# Patient Record
Sex: Female | Born: 1995
Health system: Southern US, Community
[De-identification: ages and names within clinical notes are randomized; demographics above are authoritative.]

## PROBLEM LIST (undated history)

## (undated) DIAGNOSIS — F419 Anxiety disorder, unspecified: Secondary | ICD-10-CM

## (undated) DIAGNOSIS — Z0000 Encounter for general adult medical examination without abnormal findings: Secondary | ICD-10-CM

## (undated) DIAGNOSIS — Z309 Encounter for contraceptive management, unspecified: Secondary | ICD-10-CM

## (undated) DIAGNOSIS — T7840XA Allergy, unspecified, initial encounter: Secondary | ICD-10-CM

## (undated) HISTORY — DX: Allergy, unspecified, initial encounter: T78.40XA

## (undated) HISTORY — DX: Encounter for contraceptive management, unspecified: Z30.9

---
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
## 2018-12-01 ENCOUNTER — Ambulatory Visit
Admission: RE | Admit: 2018-12-01 | Discharge: 2018-12-01 | Disposition: A | Discharge status: Home / Self Care | Payer: No Typology Code available for payment source | Source: Ambulatory Visit | Attending: Nurse Practitioner | Admitting: Nurse Practitioner

## 2018-12-01 ENCOUNTER — Other Ambulatory Visit: Payer: Self-pay | Admitting: Nurse Practitioner

## 2018-12-01 DIAGNOSIS — Z021 Encounter for pre-employment examination: Secondary | ICD-10-CM

## 2019-03-07 ENCOUNTER — Other Ambulatory Visit: Payer: Self-pay

## 2019-03-07 ENCOUNTER — Encounter: Payer: Self-pay | Admitting: Adult Health Nurse Practitioner

## 2019-03-07 ENCOUNTER — Ambulatory Visit (INDEPENDENT_AMBULATORY_CARE_PROVIDER_SITE_OTHER): Payer: No Typology Code available for payment source | Admitting: Adult Health Nurse Practitioner

## 2019-03-07 DIAGNOSIS — Z0000 Encounter for general adult medical examination without abnormal findings: Secondary | ICD-10-CM | POA: Diagnosis not present

## 2019-03-07 HISTORY — DX: Encounter for general adult medical examination without abnormal findings: Z00.00

## 2019-03-07 NOTE — Progress Notes (Signed)
Chief Complaint  Patient presents with  . Establish Care    HPI  Patient is a pleasant 23 year old female who presents to establish care and for a physical exam.  Pronouns are she, hers.  She is married x 1 year.  Trying to conceive.  Was working with her previous PCP, Dr. Blase Mess in [place] and currently on Letrozole.  She has completed 4 cycles without a pregnancy.  Currently has an appointment in October for OB here in [place], [place].  She is not necessarily in a rush to have a child but would like to understand why she hasn't been able to conceive yet and wishes to have children in her 5s.   Last PAP smear 1 year ago with PCP.  Will work on obtaining records.   Margaruite has no complaints today.   Past Medical History:  Diagnosis Date  . Allergy   . Physical exam, annual 03/07/2019    No current outpatient medications on file.   No current facility-administered medications for this visit.     Allergies: Not on File  History reviewed. No pertinent surgical history.  Social History   Socioeconomic History  . Marital status: Married    Spouse name: Not on file  . Number of children: 0  . Years of education: Not on file  . Highest education level: Not on file  Occupational History  . Occupation: patient access specialist    Employer: [employer]  Social Needs  . Financial resource strain: Not on file  . Food insecurity    Worry: Not on file    Inability: Not on file  . Transportation needs    Medical: Not on file    Non-medical: Not on file  Tobacco Use  . Smoking status: Never Smoker  . Smokeless tobacco: Never Used  Substance and Sexual Activity  . Alcohol use: Never    Frequency: Never  . Drug use: Never  . Sexual activity: Yes  Lifestyle  . Physical activity    Days per week: Not on file    Minutes per session: Not on file  . Stress: Not on file  Relationships  . Social Musician on phone: Not on file    Gets together: Not on file     Attends religious service: Not on file    Active member of club or organization: Not on file    Attends meetings of clubs or organizations: Not on file    Relationship status: Not on file  Other Topics Concern  . Not on file  Social History Narrative  . Not on file    History reviewed. Mother, 62, with sarcoid. Father, HTN MGM:  Breast Cancer, alive Maternal GGM:  Breast Cancer    ROS Review of Systems See HPI Constitution: No fevers or chills No malaise No diaphoresis Skin: No rash or itching Eyes: no blurry vision, no double vision GU: no dysuria or hematuria Neuro: no dizziness or headaches all others reviewed and negative   Objective: Vitals:   03/07/19 0804  BP: 118/71  Pulse: 85  Resp: 18  Temp: 98.1 F (36.7 C)  TempSrc: Oral  SpO2: 100%  Weight: 117 lb 6.4 oz (53.3 kg)  Height: 5\' 1"  (1.549 m)    Physical Exam Constitutional:      Appearance: Normal appearance. She is normal weight.  HENT:     Head: Normocephalic and atraumatic.     Right Ear: Tympanic membrane, ear canal and external  ear normal.     Left Ear: Tympanic membrane, ear canal and external ear normal.     Nose: Nose normal.  Eyes:     Extraocular Movements: Extraocular movements intact.     Conjunctiva/sclera: Conjunctivae normal.     Pupils: Pupils are equal, round, and reactive to light.  Neck:     Musculoskeletal: Normal range of motion and neck supple.  Cardiovascular:     Rate and Rhythm: Normal rate and regular rhythm.     Pulses: Normal pulses.     Heart sounds: Normal heart sounds. No murmur.  Pulmonary:     Effort: Pulmonary effort is normal.     Breath sounds: Normal breath sounds.  Abdominal:     Palpations: Abdomen is soft.  Musculoskeletal: Normal range of motion.  Skin:    General: Skin is warm and dry.     Capillary Refill: Capillary refill takes less than 2 seconds.  Neurological:     General: No focal deficit present.     Mental Status: She is alert and  oriented to person, place, and time.  Psychiatric:        Mood and Affect: Mood normal.        Behavior: Behavior normal.        Thought Content: Thought content normal.        Judgment: Judgment normal.       Assessment and Plan Amoree was seen today for establish care.  Diagnoses and all orders for this visit:  Physical exam, annual   1.  All questions answered.  Will obtain PAP record from previous PCP.  Patient is UTD on vaccines.  Had flu shot for work. Will f/u as needed or within 1 year.  She is inline with this plan.   Glyn Ade

## 2019-03-07 NOTE — Patient Instructions (Signed)
Health Maintenance, Female Adopting a healthy lifestyle and getting preventive care are important in promoting health and wellness. Ask your health care provider about:  The right schedule for you to have regular tests and exams.  Things you can do on your own to prevent diseases and keep yourself healthy. What should I know about diet, weight, and exercise? Eat a healthy diet   Eat a diet that includes plenty of vegetables, fruits, low-fat dairy products, and lean protein.  Do not eat a lot of foods that are high in solid fats, added sugars, or sodium. Maintain a healthy weight Body mass index (BMI) is used to identify weight problems. It estimates body fat based on height and weight. Your health care provider can help determine your BMI and help you achieve or maintain a healthy weight. Get regular exercise Get regular exercise. This is one of the most important things you can do for your health. Most adults should:  Exercise for at least 150 minutes each week. The exercise should increase your heart rate and make you sweat (moderate-intensity exercise).  Do strengthening exercises at least twice a week. This is in addition to the moderate-intensity exercise.  Spend less time sitting. Even light physical activity can be beneficial. Watch cholesterol and blood lipids Have your blood tested for lipids and cholesterol at 23 years of age, then have this test every 5 years. Have your cholesterol levels checked more often if:  Your lipid or cholesterol levels are high.  You are older than 23 years of age.  You are at high risk for heart disease. What should I know about cancer screening? Depending on your health history and family history, you may need to have cancer screening at various ages. This may include screening for:  Breast cancer.  Cervical cancer.  Colorectal cancer.  Skin cancer.  Lung cancer. What should I know about heart disease, diabetes, and high blood  pressure? Blood pressure and heart disease  High blood pressure causes heart disease and increases the risk of stroke. This is more likely to develop in people who have high blood pressure readings, are of African descent, or are overweight.  Have your blood pressure checked: ? Every 3-5 years if you are 18-39 years of age. ? Every year if you are 40 years old or older. Diabetes Have regular diabetes screenings. This checks your fasting blood sugar level. Have the screening done:  Once every three years after age 40 if you are at a normal weight and have a low risk for diabetes.  More often and at a younger age if you are overweight or have a high risk for diabetes. What should I know about preventing infection? Hepatitis B If you have a higher risk for hepatitis B, you should be screened for this virus. Talk with your health care provider to find out if you are at risk for hepatitis B infection. Hepatitis C Testing is recommended for:  Everyone born from 1945 through 1965.  Anyone with known risk factors for hepatitis C. Sexually transmitted infections (STIs)  Get screened for STIs, including gonorrhea and chlamydia, if: ? You are sexually active and are younger than 24 years of age. ? You are older than 24 years of age and your health care provider tells you that you are at risk for this type of infection. ? Your sexual activity has changed since you were last screened, and you are at increased risk for chlamydia or gonorrhea. Ask your health care provider if   you are at risk.  Ask your health care provider about whether you are at high risk for HIV. Your health care provider may recommend a prescription medicine to help prevent HIV infection. If you choose to take medicine to prevent HIV, you should first get tested for HIV. You should then be tested every 3 months for as long as you are taking the medicine. Pregnancy  If you are about to stop having your period (premenopausal) and  you may become pregnant, seek counseling before you get pregnant.  Take 400 to 800 micrograms (mcg) of folic acid every day if you become pregnant.  Ask for birth control (contraception) if you want to prevent pregnancy. Osteoporosis and menopause Osteoporosis is a disease in which the bones lose minerals and strength with aging. This can result in bone fractures. If you are 65 years old or older, or if you are at risk for osteoporosis and fractures, ask your health care provider if you should:  Be screened for bone loss.  Take a calcium or vitamin D supplement to lower your risk of fractures.  Be given hormone replacement therapy (HRT) to treat symptoms of menopause. Follow these instructions at home: Lifestyle  Do not use any products that contain nicotine or tobacco, such as cigarettes, e-cigarettes, and chewing tobacco. If you need help quitting, ask your health care provider.  Do not use street drugs.  Do not share needles.  Ask your health care provider for help if you need support or information about quitting drugs. Alcohol use  Do not drink alcohol if: ? Your health care provider tells you not to drink. ? You are pregnant, may be pregnant, or are planning to become pregnant.  If you drink alcohol: ? Limit how much you use to 0-1 drink a day. ? Limit intake if you are breastfeeding.  Be aware of how much alcohol is in your drink. In the [place] one drink equals one 12 oz bottle of beer (355 mL), one 5 oz glass of wine (148 mL), or one 1 oz glass of hard liquor (44 mL). General instructions  Schedule regular health, dental, and eye exams.  Stay current with your vaccines.  Tell your health care provider if: ? You often feel depressed. ? You have ever been abused or do not feel safe at home. Summary  Adopting a healthy lifestyle and getting preventive care are important in promoting health and wellness.  Follow your health care provider's instructions about healthy  diet, exercising, and getting tested or screened for diseases.  Follow your health care provider's instructions on monitoring your cholesterol and blood pressure. This information is not intended to replace advice given to you by your health care provider. Make sure you discuss any questions you have with your health care provider. Document Released: 12/07/2010 Document Revised: 05/17/2018 Document Reviewed: 05/17/2018 Elsevier Patient Education  2020 Elsevier Inc.  

## 2019-04-06 ENCOUNTER — Ambulatory Visit (INDEPENDENT_AMBULATORY_CARE_PROVIDER_SITE_OTHER): Payer: No Typology Code available for payment source | Admitting: Adult Health Nurse Practitioner

## 2019-04-06 ENCOUNTER — Other Ambulatory Visit: Payer: Self-pay

## 2019-04-06 ENCOUNTER — Encounter: Payer: Self-pay | Admitting: Adult Health Nurse Practitioner

## 2019-04-06 VITALS — BP 112/71 | HR 82 | Temp 98.5°F | Resp 18 | Ht 61.42 in | Wt 119.0 lb

## 2019-04-06 DIAGNOSIS — F419 Anxiety disorder, unspecified: Secondary | ICD-10-CM

## 2019-04-06 HISTORY — DX: Anxiety disorder, unspecified: F41.9

## 2019-04-06 MED ORDER — CLONAZEPAM 0.5 MG PO TABS: ORAL_TABLET | ORAL | 0 refills | Status: DC

## 2019-04-06 MED ORDER — SERTRALINE HCL 50 MG PO TABS: ORAL_TABLET | ORAL | 3 refills | Status: DC

## 2019-04-06 NOTE — Progress Notes (Signed)
Established Patient Office Visit  Subjective:  Patient ID: Cheryl Perkins, female    DOB: 1995/09/04  Age: 23 y.o. MRN: [id number]  CC:  Chief Complaint  Patient presents with  . Anxiety    worsening tension, not able to talk herself down from it any more.  some panic attacks. feels like all of this has been worsening throughout the year.  has new job since august.     HPI [employer] presents for worsening anxiety.  She acknowledges that she has had anxiety throughout the year and when seen before, it did not seem like it was at the point of needing intervention.  However, over the past 1-2 months, anxiety has increased often manifested by anger, discomfort. Life is stressful and other events such as her dad who has been absent in her life texting her that he missed her. This has caused an uptake in her stress and anxiety.  Previously, she felt that her anxiety was something she could talk through.  Right now, though; it is affecting her day to day life and is ready to discuss medication for anxiety.  She denies any feelings of hurting self or others.  She does have a very supportive husband who has been there to bring her here today and to listen.  She is open to talking to a therapist.  Her husband had encouraged that.   Past Medical History:  Diagnosis Date  . Allergy   . Physical exam, annual 03/07/2019    Past Surgical History:  Procedure Laterality Date  . KNEE ARTHROSCOPY WITH ANTERIOR CRUCIATE LIGAMENT (ACL) REPAIR  2013   had first 2010    Family History  Problem Relation Age of Onset  . Sarcoidosis Mother   . Hypertension Father   . Healthy Brother   . Breast cancer Maternal Grandfather   . Breast cancer Maternal Great-grandmother       Outpatient Medications Prior to Visit  Medication Sig Dispense Refill  . letrozole (FEMARA) 2.5 MG tablet Take by mouth.     No facility-administered medications prior to visit.     Allergies  Allergen Reactions  .  Dicyclomine Rash    ROS Review of Systems   Psychological ROS: positive for - anxiety and irritability negative for - behavioral disorder    Objective:    Physical Exam  Constitutional: She is oriented to person, place, and time. She appears well-developed and well-nourished.  Neurological: She is alert and oriented to person, place, and time.  Psychiatric: Her speech is normal and behavior is normal. Judgment normal. Her mood appears anxious. Her affect is not angry and not labile. Cognition and memory are normal. She expresses no suicidal ideation. She expresses no suicidal plans.    BP 112/71 (BP Location: Right Arm, Patient Position: Sitting, Cuff Size: Normal)   Pulse 82   Temp 98.5 F (36.9 C) (Oral)   Resp 18   Ht 5' 1.42" (1.56 m)   Wt 119 lb (54 kg)   LMP 03/21/2019   SpO2 97%   BMI 22.18 kg/m  Wt Readings from Last 3 Encounters:  04/06/19 119 lb (54 kg)  03/07/19 117 lb 6.4 oz (53.3 kg)    . Health Maintenance Due  Topic Date Due  . HIV Screening  05/17/2011  . TETANUS/TDAP  05/17/2015     Assessment & Plan:    1. Moderate anxiety    Meds ordered this encounter  Medications  . sertraline (ZOLOFT) 50 MG tablet  Sig: 1/2 tab for 1 week, then 1 tab daily    Dispense:  30 tablet    Refill:  3  . clonazePAM (KLONOPIN) 0.5 MG tablet    Sig: 1/2 to 1 tab up to two times daily    Dispense:  30 tablet    Refill:  0     Follow-up: Return in about 1 month (around 05/07/2019).    Elyse Jarvis, NP   A total of 25 minutes were spent face-to-face with the patient during this encounter and over half of that time was spent on counseling and coordination of care.

## 2019-04-27 ENCOUNTER — Ambulatory Visit (INDEPENDENT_AMBULATORY_CARE_PROVIDER_SITE_OTHER): Payer: No Typology Code available for payment source | Admitting: Psychology

## 2019-04-27 DIAGNOSIS — F4321 Adjustment disorder with depressed mood: Secondary | ICD-10-CM

## 2019-04-30 ENCOUNTER — Telehealth: Payer: Self-pay | Admitting: Adult Health Nurse Practitioner

## 2019-04-30 NOTE — Telephone Encounter (Signed)
Please advise if pt needs appt or when referral is ordered  Copied from [place] [phone]. Topic: Referral - Request for Referral >> Apr 30, 2019  2:56 PM Scherrie Gerlach wrote: Pt has [employer] and needs referral to [facility].  Fax: [phone]

## 2019-05-01 ENCOUNTER — Other Ambulatory Visit: Payer: Self-pay

## 2019-05-01 DIAGNOSIS — N979 Female infertility, unspecified: Secondary | ICD-10-CM

## 2019-05-01 NOTE — Telephone Encounter (Signed)
Referral has been placed and pt informed

## 2019-05-08 ENCOUNTER — Encounter: Payer: Self-pay | Admitting: Adult Health Nurse Practitioner

## 2019-05-08 ENCOUNTER — Other Ambulatory Visit: Payer: Self-pay

## 2019-05-08 ENCOUNTER — Ambulatory Visit (INDEPENDENT_AMBULATORY_CARE_PROVIDER_SITE_OTHER): Payer: No Typology Code available for payment source | Admitting: Adult Health Nurse Practitioner

## 2019-05-08 VITALS — BP 120/77 | HR 90 | Temp 98.3°F | Resp 17 | Ht 61.42 in | Wt 121.4 lb

## 2019-05-08 DIAGNOSIS — F419 Anxiety disorder, unspecified: Secondary | ICD-10-CM | POA: Diagnosis not present

## 2019-05-08 MED ORDER — SERTRALINE HCL 50 MG PO TABS: 50.0000 mg | ORAL_TABLET | Freq: Every day | ORAL | 4 refills | Status: DC

## 2019-05-08 NOTE — Patient Instructions (Signed)
° ° ° °  If you have lab work done today you will be contacted with your lab results within the next 2 weeks.  If you have not heard from us then please contact us. The fastest way to get your results is to register for My Chart. ° ° °IF you received an x-ray today, you will receive an invoice from [facility]. Please contact [facility] at [phone] with questions or concerns regarding your invoice.  ° °IF you received labwork today, you will receive an invoice from [facility]. Please contact [facility] at [phone] with questions or concerns regarding your invoice.  ° °Our billing staff will not be able to assist you with questions regarding bills from these companies. ° °You will be contacted with the lab results as soon as they are available. The fastest way to get your results is to activate your My Chart account. Instructions are located on the last page of this paperwork. If you have not heard from us regarding the results in 2 weeks, please contact this office. °  ° ° ° °

## 2019-05-18 ENCOUNTER — Ambulatory Visit (INDEPENDENT_AMBULATORY_CARE_PROVIDER_SITE_OTHER): Payer: No Typology Code available for payment source | Admitting: Psychology

## 2019-05-18 DIAGNOSIS — F4321 Adjustment disorder with depressed mood: Secondary | ICD-10-CM

## 2019-06-15 ENCOUNTER — Ambulatory Visit (INDEPENDENT_AMBULATORY_CARE_PROVIDER_SITE_OTHER): Payer: No Typology Code available for payment source | Admitting: Psychology

## 2019-06-15 DIAGNOSIS — F4321 Adjustment disorder with depressed mood: Secondary | ICD-10-CM

## 2019-07-06 ENCOUNTER — Ambulatory Visit (INDEPENDENT_AMBULATORY_CARE_PROVIDER_SITE_OTHER): Payer: No Typology Code available for payment source | Admitting: Psychology

## 2019-07-06 DIAGNOSIS — F4321 Adjustment disorder with depressed mood: Secondary | ICD-10-CM | POA: Diagnosis not present

## 2019-07-27 ENCOUNTER — Ambulatory Visit: Payer: No Typology Code available for payment source | Admitting: Psychology

## 2019-08-07 ENCOUNTER — Ambulatory Visit: Payer: No Typology Code available for payment source | Admitting: Adult Health Nurse Practitioner

## 2019-08-16 ENCOUNTER — Other Ambulatory Visit: Payer: Self-pay

## 2019-08-16 ENCOUNTER — Ambulatory Visit (INDEPENDENT_AMBULATORY_CARE_PROVIDER_SITE_OTHER): Payer: No Typology Code available for payment source | Admitting: Adult Health Nurse Practitioner

## 2019-08-16 ENCOUNTER — Encounter: Payer: Self-pay | Admitting: Adult Health Nurse Practitioner

## 2019-08-16 VITALS — BP 115/74 | HR 76 | Temp 98.7°F | Ht 60.0 in | Wt 120.0 lb

## 2019-08-16 DIAGNOSIS — R6882 Decreased libido: Secondary | ICD-10-CM

## 2019-08-16 DIAGNOSIS — J301 Allergic rhinitis due to pollen: Secondary | ICD-10-CM | POA: Diagnosis not present

## 2019-08-16 MED ORDER — BUPROPION HCL ER (XL) 150 MG PO TB24: 150.0000 mg | ORAL_TABLET | Freq: Every day | ORAL | 3 refills | Status: DC

## 2019-08-16 MED ORDER — MOMETASONE FUROATE 50 MCG/ACT NA SUSP: 2.0000 | Freq: Every day | NASAL | 3 refills | Status: DC

## 2019-08-16 NOTE — Progress Notes (Signed)
History   Chief Complaint  Patient presents with  . Epistaxis    Pt stated that she went to sneeze this moring a a large amount of blood came up. She stated that this is the first time that this has happened.    HPI   Cheryl Perkins is having increased allergic rhinitis symptoms as the pollen has started.  Mostly stuffy nose but this morning, she sneezed and had a bloody nose which was significant for her.  Her husband helped stop the bloody nose.  No symptoms currently except for the allergies.  We discussed using a nasal steroid to help symptoms.    Anxiety/Depression: Stable currently.  Cheryl Perkins  Lost her grandmother in 1/21.  She is still grieving but is able to be able to strong for her family.  She is questioning whether Zoloft can affect sexual drive.  We discussed side effects of SSRIs.  She does not want to decrease dose of Zoloft which is effective for her right now with her anxiety.  We discussed starting Wellbutrin 150mg  to help with sexual side effects.  Will start at 150mg  but can increase depending on how patient responds.  She is amenable to that.    Past Medical History:  Diagnosis Date  . Allergy   . Moderate anxiety 04/06/2019  . Physical exam, annual 03/07/2019   Past Surgical History:  Procedure Laterality Date  . KNEE ARTHROSCOPY WITH ANTERIOR CRUCIATE LIGAMENT (ACL) REPAIR  2013   had first 2010   Family History  Problem Relation Age of Onset  . Sarcoidosis Mother   . Hypertension Father   . Healthy Brother   . Breast cancer Maternal Grandfather   . Breast cancer Maternal Great-grandmother    Social History   Tobacco Use  . Smoking status: Never Smoker  . Smokeless tobacco: Never Used  Substance Use Topics  . Alcohol use: Never  . Drug use: Never   OB History   No obstetric history on file.    Review of Systems   Review of Systems  Constitutional: Negative for activity change, appetite change, chills and fever.  HENT: Negative for  congestion, nosebleeds, trouble swallowing and voice change.   Respiratory: Negative for cough, shortness of breath and wheezing.   Gastrointestinal: Negative for diarrhea, nausea and vomiting.  Genitourinary: Negative for difficulty urinating, dysuria, flank pain and hematuria.  Musculoskeletal: Negative for back pain, joint swelling and neck pain.  Neurological: Negative for dizziness, speech difficulty, light-headedness and numbness. Psychological ROS: positive for - sexual side effects:  decrease libido negative for - anxiety, depression or mood swings  See HPI. All other review of systems negative.    Allergies   Dicyclomine  Home Medications    Current Outpatient Medications:  .  clonazePAM (KLONOPIN) 0.5 MG tablet, 1/2 to 1 tab up to two times daily, Disp: 30 tablet, Rfl: 0 .  buPROPion (WELLBUTRIN XL) 150 MG 24 hr tablet, Take 1 tablet (150 mg total) by mouth daily., Disp: 30 tablet, Rfl: 3 .  letrozole (FEMARA) 2.5 MG tablet, Take by mouth., Disp: , Rfl:  .  mometasone (NASONEX) 50 MCG/ACT nasal spray, Place 2 sprays into the nose daily., Disp: 17 g, Rfl: 3 .  sertraline (ZOLOFT) 50 MG tablet, Take 1 tablet (50 mg total) by mouth daily., Disp: 30 tablet, Rfl: 4  Meds Ordered and Administered this Visit   Meds ordered this encounter  Medications  . mometasone (NASONEX) 50 MCG/ACT nasal spray    Sig: Place  2 sprays into the nose daily.    Dispense:  17 g    Refill:  3  . buPROPion (WELLBUTRIN XL) 150 MG 24 hr tablet    Sig: Take 1 tablet (150 mg total) by mouth daily.    Dispense:  30 tablet    Refill:  3    BP 115/74 (BP Location: Right Arm, Patient Position: Sitting, Cuff Size: Normal)   Pulse 76   Temp 98.7 F (37.1 C) (Temporal)   Ht 5' (1.524 m)   Wt 120 lb (54.4 kg)   LMP 08/13/2019   SpO2 100%   BMI 23.44 kg/m   Physical Exam   General appearance: alert, well appearing, and in no distress, oriented to person, place, and time and anxious. Chest:  clear to auscultation, no wheezes, rales or rhonchi, symmetric air entry.  CVS exam: normal rate, regular rhythm, normal S1, S2, no murmurs, rubs, clicks or gallops. Skin exam - normal coloration and turgor, no rashes, no suspicious skin lesions noted. Mental Status: normal mood, behavior, speech, dress, motor activity, and thought processes, anxious.    MDM   1. Decreased libido without sexual dysfunction   2. Seasonal allergic rhinitis due to pollen    Meds ordered this encounter  Medications  . mometasone (NASONEX) 50 MCG/ACT nasal spray    Sig: Place 2 sprays into the nose daily.    Dispense:  17 g    Refill:  3  . buPROPion (WELLBUTRIN XL) 150 MG 24 hr tablet    Sig: Take 1 tablet (150 mg total) by mouth daily.    Dispense:  30 tablet    Refill:  3   Will f/u at next scheduled appointment.  Patient is inline with this plan.

## 2019-08-16 NOTE — Patient Instructions (Signed)
° ° ° °  If you have lab work done today you will be contacted with your lab results within the next 2 weeks.  If you have not heard from us then please contact us. The fastest way to get your results is to register for My Chart. ° ° °IF you received an x-ray today, you will receive an invoice from [facility]. Please contact [facility] at [phone] with questions or concerns regarding your invoice.  ° °IF you received labwork today, you will receive an invoice from [facility]. Please contact [facility] at [phone] with questions or concerns regarding your invoice.  ° °Our billing staff will not be able to assist you with questions regarding bills from these companies. ° °You will be contacted with the lab results as soon as they are available. The fastest way to get your results is to activate your My Chart account. Instructions are located on the last page of this paperwork. If you have not heard from us regarding the results in 2 weeks, please contact this office. °  ° ° ° °

## 2019-09-14 ENCOUNTER — Ambulatory Visit (INDEPENDENT_AMBULATORY_CARE_PROVIDER_SITE_OTHER): Payer: No Typology Code available for payment source | Admitting: Adult Health Nurse Practitioner

## 2019-09-14 ENCOUNTER — Encounter: Payer: Self-pay | Admitting: Adult Health Nurse Practitioner

## 2019-09-14 ENCOUNTER — Other Ambulatory Visit: Payer: Self-pay

## 2019-09-14 VITALS — BP 121/73 | HR 89 | Temp 98.6°F | Ht 60.0 in | Wt 119.4 lb

## 2019-09-14 DIAGNOSIS — F419 Anxiety disorder, unspecified: Secondary | ICD-10-CM

## 2019-09-14 MED ORDER — BUPROPION HCL ER (XL) 150 MG PO TB24: 150.0000 mg | ORAL_TABLET | Freq: Every day | ORAL | 2 refills | Status: DC

## 2019-09-14 MED ORDER — SERTRALINE HCL 50 MG PO TABS: 50.0000 mg | ORAL_TABLET | Freq: Every day | ORAL | 0 refills | Status: DC

## 2019-09-14 NOTE — Patient Instructions (Signed)
° ° ° °  If you have lab work done today you will be contacted with your lab results within the next 2 weeks.  If you have not heard from us then please contact us. The fastest way to get your results is to register for My Chart. ° ° °IF you received an x-ray today, you will receive an invoice from [facility]. Please contact [facility] at [phone] with questions or concerns regarding your invoice.  ° °IF you received labwork today, you will receive an invoice from [facility]. Please contact [facility] at [phone] with questions or concerns regarding your invoice.  ° °Our billing staff will not be able to assist you with questions regarding bills from these companies. ° °You will be contacted with the lab results as soon as they are available. The fastest way to get your results is to activate your My Chart account. Instructions are located on the last page of this paperwork. If you have not heard from us regarding the results in 2 weeks, please contact this office. °  ° ° ° °

## 2019-09-14 NOTE — Progress Notes (Signed)
Subjective:     Monalisa Bayless is a 24 y.o. female who presents for follow up of anxiety disorder. Current symptoms: none. She denies current suicidal and homicidal ideation. She complains of the following side effects from the treatment: none.  Recently saw the fertility specialist who put her back on Femhara which is giving her hot flashes.  She does not feel bad on it.  She is working with the fertility specialist and seems to be happier with this one.   The following portions of the patient's history were reviewed and updated as appropriate: allergies, current medications, past family history, past medical history, past social history, past surgical history and problem list.    Objective:    BP 121/73   Pulse 89   Temp 98.6 F (37 C) (Temporal)   Ht 5' (1.524 m)   Wt 119 lb 6.4 oz (54.2 kg)   LMP 09/11/2019   SpO2 100%   BMI 23.32 kg/m   General:  alert and cooperative  Affect/Behavior:  full facial expressions, good insight, normal speech pattern and content and normal thought patterns no abnormals      Assessment:    Anxiety Disorder - improving    Plan:    Medications: Wellbutrin and Zoloft. Follow up: 3 months.

## 2019-10-18 NOTE — Progress Notes (Signed)
    Subjective:     Cheryl Perkins is a 24 y.o. female who presents for follow up of anxiety/depression/adjustment disorder. Current symptoms: fatigue. She denies current suicidal and homicidal ideation. She complains of the following side effects from the treatment: none.  The following portions of the patient's history were reviewed and updated as appropriate: allergies, current medications, past family history, past medical history, past social history, past surgical history and problem list.     GAD-7 PHQ-9    GAD 7 : Generalized Anxiety Score 09/14/2019 08/16/2019 03/07/2019  Nervous, Anxious, on Edge 0 0 0  Control/stop worrying 0 0 0  Worry too much - different things 0 0 0  Trouble relaxing 0 0 0  Restless 0 0 0  Easily annoyed or irritable 0 0 0  Afraid - awful might happen 0 0 0  Total GAD 7 Score 0 0 0  Anxiety Difficulty Not difficult at all Not difficult at all Not difficult at all    [facility] SCORE ONLY 09/14/2019 08/16/2019 05/08/2019  Score 0 0 0       Meds     Current Meds  Medication Sig  . clonazePAM (KLONOPIN) 0.5 MG tablet 1/2 to 1 tab up to two times daily  . [DISCONTINUED] sertraline (ZOLOFT) 50 MG tablet 1/2 tab for 1 week, then 1 tab daily     Psych Hx/Self and Family Hx   Anxiousness   ROS    Objective:    BP 120/77 (BP Location: Right Arm, Patient Position: Sitting, Cuff Size: Normal)   Pulse 90   Temp 98.3 F (36.8 C) (Oral)   Resp 17   Ht 5' 1.42" (1.56 m)   Wt 121 lb 6.4 oz (55.1 kg)   LMP 04/19/2019   SpO2 99%   BMI 22.63 kg/m   General:  alert and cooperative  Affect/Behavior:  full facial expressions and normal reasoning {      Assessment:    Anxiety Disorder - improving    1. Moderate anxiety     Plan:    Treatment plan reviewed with the patient.  Meds ordered this encounter  Medications  . DISCONTD: sertraline (ZOLOFT) 50 MG tablet    Sig: Take 1 tablet (50 mg total) by mouth daily.    Dispense:  30 tablet   Refill:  4    Lab Orders  No laboratory test(s) ordered today    No orders of the defined types were placed in this encounter.

## 2020-03-28 IMAGING — CR CHEST  1 VIEW
1 series · 1 of 1 positions shown · non-contrast
Comparison: None.

CLINICAL DATA: Pre-employment physical examination

EXAM:
CHEST  1 VIEW

[w chest pa]
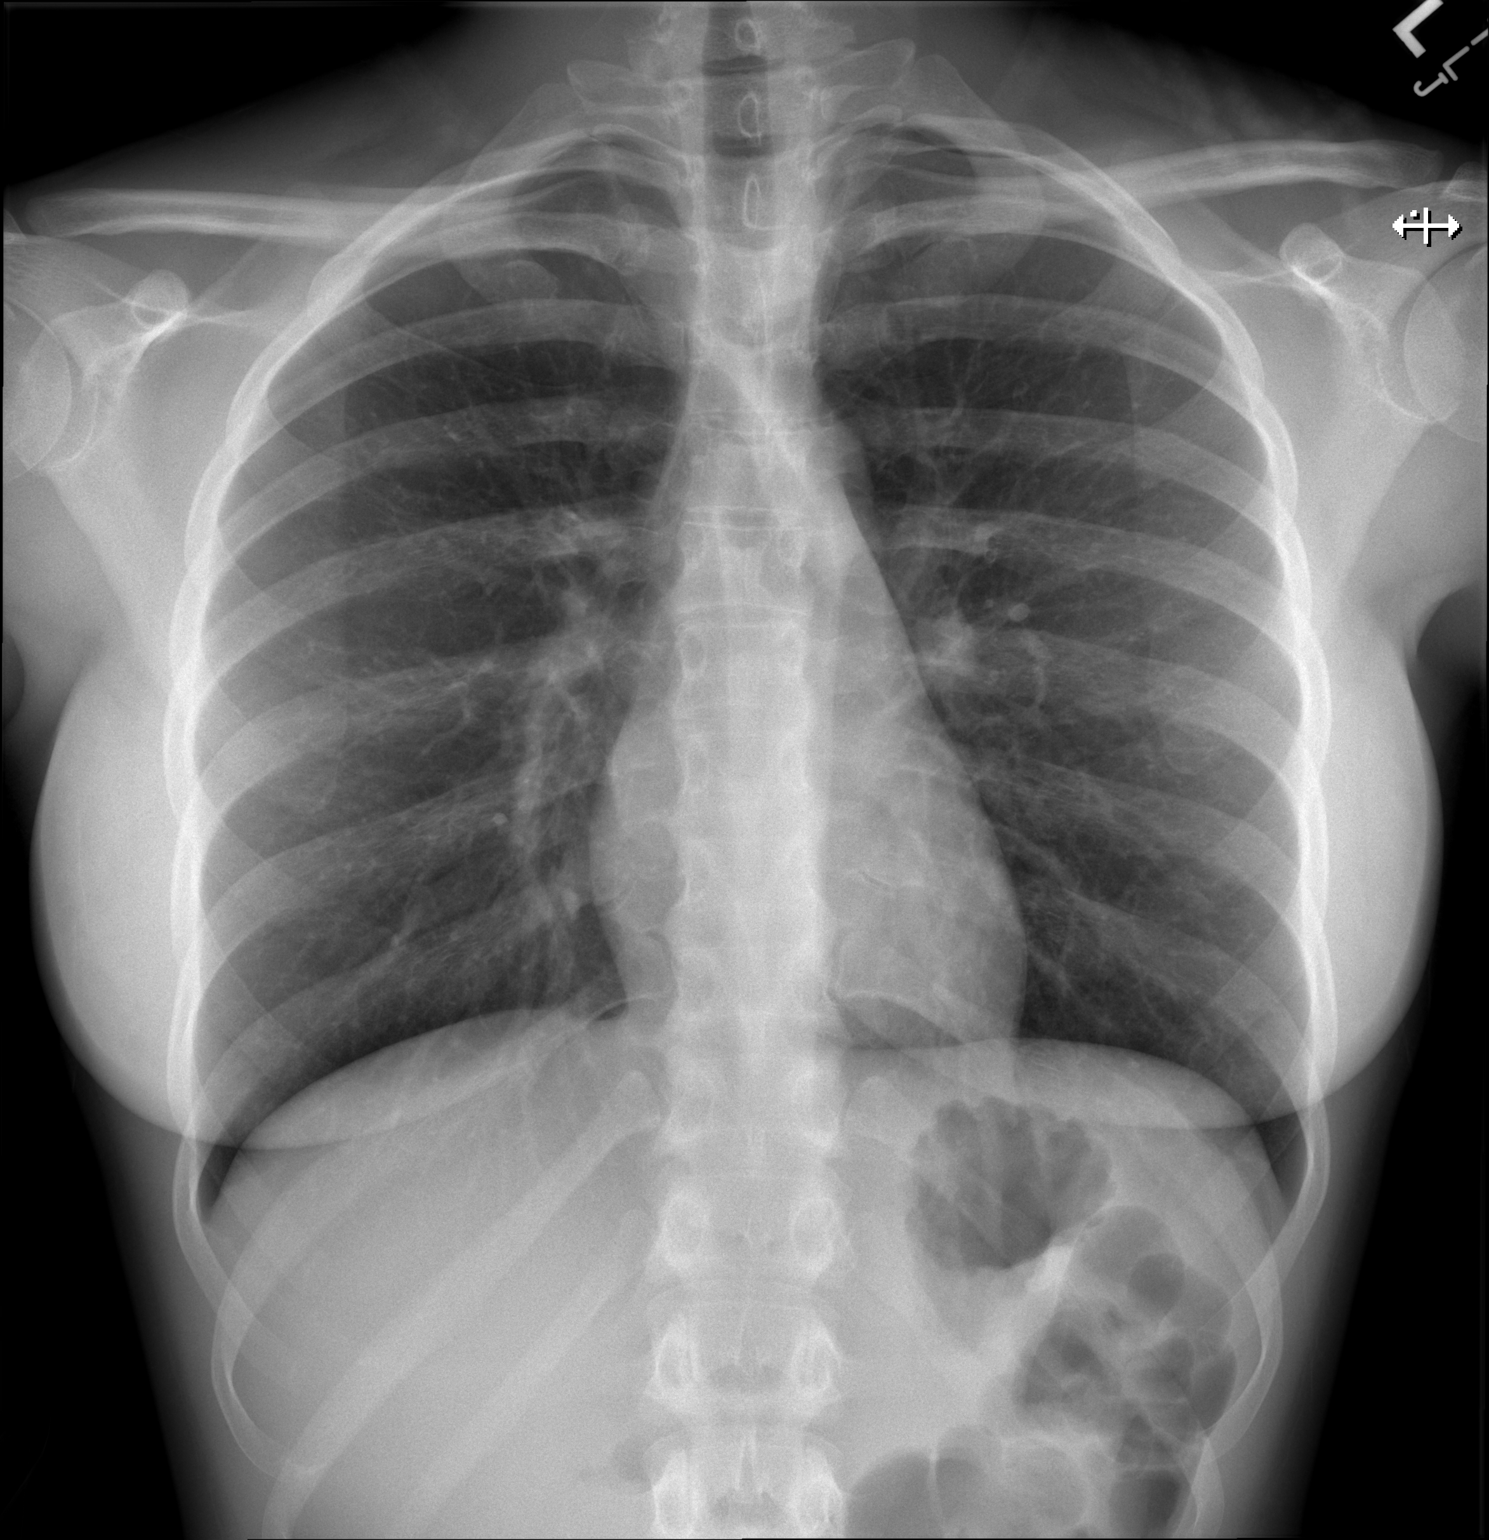

[1 of 1 positions shown; findings below may reference images not displayed]

FINDINGS: Lungs are clear. Heart size and pulmonary vascularity are normal. No
adenopathy. No bone lesions.
IMPRESSION: No abnormality noted.

## 2020-06-07 NOTE — L&D Delivery Note (Signed)
Delivery Note Patient pushed well for 15 minutes.  At 6:19 PM a viable female was delivered via Vaginal, Spontaneous (Presentation: Right Occiput Anterior).  APGAR: 9, 9; weight 3045 gm (6lb 11.4oz).   Placenta status: Spontaneous, Intact.  Cord: 3 vessels with the following complications: None.  Cord pH: n/a  Anesthesia: Epidural Episiotomy: None Lacerations:  None Suture Repair:  n/a Est. Blood Loss (mL):  50 mL  Mom to postpartum.  Baby to Couplet care / Skin to Skin.  [facility] GEFFEL Cheryl Perkins 12/04/2020, 6:38 PM

## 2020-06-10 ENCOUNTER — Other Ambulatory Visit (HOSPITAL_COMMUNITY): Payer: Self-pay | Admitting: Obstetrics & Gynecology

## 2020-06-17 ENCOUNTER — Other Ambulatory Visit (HOSPITAL_COMMUNITY): Payer: Self-pay | Admitting: Obstetrics and Gynecology

## 2020-06-17 ENCOUNTER — Other Ambulatory Visit: Payer: Self-pay | Admitting: Obstetrics & Gynecology

## 2020-06-17 DIAGNOSIS — Z363 Encounter for antenatal screening for malformations: Secondary | ICD-10-CM

## 2020-06-17 DIAGNOSIS — Z3490 Encounter for supervision of normal pregnancy, unspecified, unspecified trimester: Secondary | ICD-10-CM | POA: Insufficient documentation

## 2020-07-09 ENCOUNTER — Encounter: Payer: Self-pay | Admitting: *Deleted

## 2020-07-10 ENCOUNTER — Other Ambulatory Visit (HOSPITAL_COMMUNITY): Payer: Self-pay | Admitting: Obstetrics & Gynecology

## 2020-07-10 DIAGNOSIS — N390 Urinary tract infection, site not specified: Secondary | ICD-10-CM | POA: Insufficient documentation

## 2020-07-11 ENCOUNTER — Encounter: Payer: Self-pay | Admitting: *Deleted

## 2020-07-11 ENCOUNTER — Other Ambulatory Visit: Payer: Self-pay | Admitting: *Deleted

## 2020-07-11 ENCOUNTER — Ambulatory Visit: Payer: No Typology Code available for payment source | Admitting: *Deleted

## 2020-07-11 ENCOUNTER — Other Ambulatory Visit: Payer: Self-pay

## 2020-07-11 ENCOUNTER — Ambulatory Visit: Payer: No Typology Code available for payment source | Attending: Obstetrics & Gynecology

## 2020-07-11 ENCOUNTER — Other Ambulatory Visit: Payer: Self-pay | Admitting: Obstetrics & Gynecology

## 2020-07-11 VITALS — BP 115/63 | HR 97

## 2020-07-11 DIAGNOSIS — Z3689 Encounter for other specified antenatal screening: Secondary | ICD-10-CM | POA: Insufficient documentation

## 2020-07-11 DIAGNOSIS — Z363 Encounter for antenatal screening for malformations: Secondary | ICD-10-CM | POA: Diagnosis present

## 2020-07-11 DIAGNOSIS — Z3490 Encounter for supervision of normal pregnancy, unspecified, unspecified trimester: Secondary | ICD-10-CM

## 2020-07-11 DIAGNOSIS — Z3A19 19 weeks gestation of pregnancy: Secondary | ICD-10-CM

## 2020-08-05 ENCOUNTER — Other Ambulatory Visit: Payer: Self-pay | Admitting: Maternal & Fetal Medicine

## 2020-08-05 ENCOUNTER — Ambulatory Visit: Payer: No Typology Code available for payment source | Attending: Maternal & Fetal Medicine

## 2020-08-05 ENCOUNTER — Other Ambulatory Visit: Payer: Self-pay

## 2020-08-05 DIAGNOSIS — Z3A22 22 weeks gestation of pregnancy: Secondary | ICD-10-CM | POA: Diagnosis not present

## 2020-08-05 DIAGNOSIS — Z3490 Encounter for supervision of normal pregnancy, unspecified, unspecified trimester: Secondary | ICD-10-CM | POA: Diagnosis present

## 2020-08-05 DIAGNOSIS — Z3493 Encounter for supervision of normal pregnancy, unspecified, third trimester: Secondary | ICD-10-CM

## 2020-08-05 DIAGNOSIS — Z363 Encounter for antenatal screening for malformations: Secondary | ICD-10-CM

## 2020-08-07 ENCOUNTER — Ambulatory Visit: Payer: No Typology Code available for payment source

## 2020-08-26 ENCOUNTER — Other Ambulatory Visit: Payer: Self-pay

## 2020-08-26 DIAGNOSIS — Z7982 Long term (current) use of aspirin: Secondary | ICD-10-CM | POA: Insufficient documentation

## 2020-08-26 DIAGNOSIS — R000 Tachycardia, unspecified: Secondary | ICD-10-CM | POA: Insufficient documentation

## 2020-08-26 DIAGNOSIS — O2393 Unspecified genitourinary tract infection in pregnancy, third trimester: Secondary | ICD-10-CM | POA: Diagnosis present

## 2020-08-26 DIAGNOSIS — Z3A26 26 weeks gestation of pregnancy: Secondary | ICD-10-CM | POA: Diagnosis not present

## 2020-08-26 NOTE — ED Triage Notes (Signed)
Patient arrived via POV c/o nausea and vomiting since 1330. Patient states she is [redacted] weeks pregnant. Patient states 8 episodes of emesis total. Patient is AO x 4, VS w/ elevated HR, normal gait.

## 2020-08-27 ENCOUNTER — Encounter (HOSPITAL_BASED_OUTPATIENT_CLINIC_OR_DEPARTMENT_OTHER): Payer: Self-pay | Admitting: Emergency Medicine

## 2020-08-27 ENCOUNTER — Other Ambulatory Visit (HOSPITAL_COMMUNITY): Payer: Self-pay | Admitting: Emergency Medicine

## 2020-08-27 ENCOUNTER — Emergency Department (HOSPITAL_BASED_OUTPATIENT_CLINIC_OR_DEPARTMENT_OTHER)
Admission: EM | Admit: 2020-08-27 | Discharge: 2020-08-27 | Disposition: A | Discharge status: Home / Self Care | Payer: No Typology Code available for payment source | Attending: Emergency Medicine | Admitting: Emergency Medicine

## 2020-08-27 DIAGNOSIS — O219 Vomiting of pregnancy, unspecified: Secondary | ICD-10-CM

## 2020-08-27 DIAGNOSIS — R8271 Bacteriuria: Secondary | ICD-10-CM

## 2020-08-27 LAB — URINALYSIS, ROUTINE W REFLEX MICROSCOPIC
Bilirubin Urine: NEGATIVE
Glucose, UA: NEGATIVE mg/dL
Hgb urine dipstick: NEGATIVE
Ketones, ur: >80 mg/dL — AB
Leukocytes,Ua: NEGATIVE
Nitrite: NEGATIVE
Protein, ur: 30 mg/dL — AB
Specific Gravity, Urine: >1.03 — ABNORMAL HIGH (ref 1.005–1.030)
pH: 6 (ref 5.0–8.0)

## 2020-08-27 LAB — URINALYSIS, MICROSCOPIC (REFLEX): Squamous Epithelial / HPF: >50 (ref 0–5)

## 2020-08-27 MED ORDER — ONDANSETRON HCL 4 MG/2ML IJ SOLN
4.0000 mg | Freq: Once | INTRAMUSCULAR | Status: AC
  Administered 2020-08-27: 4 mg via INTRAVENOUS
  Filled 2020-08-27: qty 2

## 2020-08-27 MED ORDER — ONDANSETRON 4 MG PO TBDP: 4.0000 mg | ORAL_TABLET | Freq: Three times a day (TID) | ORAL | 0 refills | Status: DC | PRN

## 2020-08-27 MED ORDER — CEPHALEXIN 500 MG PO CAPS: 500.0000 mg | ORAL_CAPSULE | Freq: Three times a day (TID) | ORAL | 0 refills | Status: DC

## 2020-08-27 MED ORDER — CEPHALEXIN 250 MG PO CAPS
500.0000 mg | ORAL_CAPSULE | Freq: Once | ORAL | Status: AC
  Administered 2020-08-27: 500 mg via ORAL
  Filled 2020-08-27: qty 2

## 2020-08-27 MED ORDER — LACTATED RINGERS IV BOLUS
1000.0000 mL | Freq: Once | INTRAVENOUS | Status: AC
  Administered 2020-08-27: 1000 mL via INTRAVENOUS

## 2020-08-27 NOTE — Progress Notes (Signed)
Pt on external monitor for about 2 hours but most of the time tracing maternal heart rate. OBRR able to see NST and pt cleared OB. Yvonna Alanis, RN made aware.

## 2020-08-27 NOTE — ED Notes (Signed)
EDP at bedside  

## 2020-08-27 NOTE — ED Notes (Signed)
Pt tolerated PO.

## 2020-08-27 NOTE — ED Notes (Signed)
Spoke with OB RR Financial risk analyst, reports that the on call MD for the pt office says that we need 5 more minutes with FHR on monitor and pt can be cleared by OB; EDP aware

## 2020-08-27 NOTE — ED Notes (Signed)
PO challenge given per EDP.  

## 2020-08-27 NOTE — Progress Notes (Signed)
Yvonna Alanis, RN at [facility] [place] called about pt about [redacted] weeks pregnant that presents with n/v and upper abdominal pain that comes and goes. Pt denies any vaginal bleeding or loss of fluid. RN to place pt on external monitor.

## 2020-08-27 NOTE — Progress Notes (Signed)
OBRR called [facility] [place] talked to [place], RN about adjusting pts external monitors fetal monitor. RN reports she will try to adjust it.

## 2020-08-27 NOTE — Progress Notes (Signed)
Dr. Tenny Craw called to notify about pt, she is currently in the OR. Will update her once she is out of OR.

## 2020-08-27 NOTE — Progress Notes (Signed)
OBRR talked to Yvonna Alanis, Charity fundraiser at [facility] about adjusting fetal monitor to obtain NST per Dr. Tenny Craw

## 2020-08-27 NOTE — Progress Notes (Signed)
OBRR talked to [facility] and external monitor adjusted. FHR tracing at this moment. OBRR will assess strip and clear pt from OB standpoint after monitoring.

## 2020-08-27 NOTE — ED Notes (Addendum)
Pt placed on fetal monitor; Called OB Rapid Response

## 2020-08-27 NOTE — ED Provider Notes (Signed)
[facility] EMERGENCY DEPARTMENT Provider Note   CSN: [id number] Arrival date & time: 08/26/20  2352     History Chief Complaint  Patient presents with  . Emesis    Cheryl Perkins is a 25 y.o. female.  HPI     This a 25 year old G1, P0 female [redacted] weeks pregnant who presents with nausea and vomiting.  Reports onset of symptoms yesterday.  She reports multiple episodes of nonbilious, nonbloody emesis.  She denies any abdominal pain or loss of fluids.  She reports generalized discomfort.  Reports good fetal movement.  She has not taken anything for her symptoms.  Denies diarrhea or known sick contacts.  Denies fever.  Past Medical History:  Diagnosis Date  . Allergy   . Moderate anxiety 04/06/2019  . Physical exam, annual 03/07/2019    Patient Active Problem List   Diagnosis Date Noted  . Decreased libido without sexual dysfunction 08/16/2019  . Seasonal allergic rhinitis due to pollen 08/16/2019  . Moderate anxiety 04/06/2019  . Physical exam, annual 03/07/2019    Past Surgical History:  Procedure Laterality Date  . KNEE ARTHROSCOPY WITH ANTERIOR CRUCIATE LIGAMENT (ACL) REPAIR  2013   had first 2010     OB History    Gravida  1   Para      Term      Preterm      AB      Living        SAB      IAB      Ectopic      Multiple      Live Births              Family History  Problem Relation Age of Onset  . Sarcoidosis Mother   . Hypertension Father   . Healthy Brother   . Breast cancer Maternal Grandfather   . Breast cancer Maternal Great-grandmother     Social History   Tobacco Use  . Smoking status: Never Smoker  . Smokeless tobacco: Never Used  Vaping Use  . Vaping Use: Never used  Substance Use Topics  . Alcohol use: Never  . Drug use: Never    Home Medications Prior to Admission medications   Medication Sig Start Date End Date Taking? Authorizing Provider  cephALEXin (KEFLEX) 500 MG capsule Take 1 capsule (500 mg  total) by mouth 3 (three) times daily. 08/27/20  Yes Horton, Mayer Masker, MD  ondansetron (ZOFRAN ODT) 4 MG disintegrating tablet Take 1 tablet (4 mg total) by mouth every 8 (eight) hours as needed. 08/27/20  Yes Horton, Mayer Masker, MD  aspirin EC 81 MG tablet Take 81 mg by mouth daily. Swallow whole.    [provider]  buPROPion (WELLBUTRIN XL) 150 MG 24 hr tablet Take 1 tablet (150 mg total) by mouth daily. 09/14/19 10/14/19  Royal Hawthorn, NP  clonazePAM Scarlette [facility]) 0.5 MG tablet 1/2 to 1 tab up to two times daily Patient not taking: Reported on 07/11/2020 04/06/19   Royal Hawthorn, NP  letrozole [facility]) 2.5 MG tablet Take by mouth. Patient not taking: Reported on 07/11/2020 12/25/18   [provider]  mometasone (NASONEX) 50 MCG/ACT nasal spray Place 2 sprays into the nose daily. Patient not taking: Reported on 07/11/2020 08/16/19   Royal Hawthorn, NP  Prenatal Vit-Fe Fumarate-FA (PRENATAL MULTIVITAMIN) TABS tablet Take 1 tablet by mouth daily at 12 noon.    [provider]  sertraline (ZOLOFT) 50 MG tablet Take 1 tablet (  50 mg total) by mouth daily. 09/14/19 12/13/19  Royal Hawthorn, NP    Allergies    Dicyclomine  Review of Systems   Review of Systems  Constitutional: Negative for fever.  Respiratory: Negative for shortness of breath.   Cardiovascular: Negative for chest pain.  Gastrointestinal: Positive for nausea and vomiting. Negative for abdominal pain, constipation and diarrhea.  Genitourinary: Negative for dysuria.  All other systems reviewed and are negative.   Physical Exam Updated Vital Signs BP (!) 120/51   Pulse 98   Temp 99 F (37.2 C) (Oral)   Resp 16   Ht 1.524 m (5')   Wt 57.2 kg   LMP 04/07/2020   SpO2 99%   BMI 24.65 kg/m   Physical Exam Vitals and nursing note reviewed.  Constitutional:      Appearance: She is well-developed. She is not ill-appearing.  HENT:     Head: Normocephalic and atraumatic.     Nose: Nose  normal.     Mouth/Throat:     Mouth: Mucous membranes are moist.  Eyes:     Pupils: Pupils are equal, round, and reactive to light.  Cardiovascular:     Rate and Rhythm: Regular rhythm. Tachycardia present.     Heart sounds: Normal heart sounds.  Pulmonary:     Effort: Pulmonary effort is normal. No respiratory distress.     Breath sounds: No wheezing.  Abdominal:     Palpations: Abdomen is soft.     Tenderness: There is no abdominal tenderness.     Comments: Gravid abdomen above the umbilicus  Musculoskeletal:     Cervical back: Neck supple.  Skin:    General: Skin is warm and dry.  Neurological:     Mental Status: She is alert and oriented to person, place, and time.  Psychiatric:        Mood and Affect: Mood normal.     ED Results / Procedures / Treatments   Labs (all labs ordered are listed, but only abnormal results are displayed) Labs Reviewed  URINALYSIS, ROUTINE W REFLEX MICROSCOPIC - Abnormal; Notable for the following components:      Result Value   APPearance HAZY (*)    Specific Gravity, Urine >1.030 (*)    Ketones, ur >80 (*)    Protein, ur 30 (*)    All other components within normal limits  URINALYSIS, MICROSCOPIC (REFLEX) - Abnormal; Notable for the following components:   Bacteria, UA MANY (*)    All other components within normal limits  URINE CULTURE    EKG None  Radiology No results found.  Procedures Procedures   Medications Ordered in ED Medications  cephALEXin (KEFLEX) capsule 500 mg (has no administration in time range)  lactated ringers bolus 1,000 mL (0 mLs Intravenous Stopped 08/27/20 0152)  ondansetron (ZOFRAN) injection 4 mg (4 mg Intravenous Given 08/27/20 0054)    ED Course  I have reviewed the triage vital signs and the nursing notes.  Pertinent labs & imaging results that were available during my care of the patient were reviewed by me and considered in my medical decision making (see chart for details).    MDM  Rules/Calculators/A&P                          Patient presents with vomiting.  She is approximately [redacted] weeks pregnant.  She is overall nontoxic and vital signs are reassuring with the exception of initial tachycardia.  Her abdominal exam  is benign.  She denies loss of fluids or bleeding.  She was placed on the monitor.  She was remotely monitor at [facility].  She was ultimately cleared by her OB/GYN with reassuring strips.  Patient was given 1 L of LR and Zofran for her symptoms.  Urinalysis was also obtained.  Urinalysis with greater than 80 ketones which likely is suggestive of dehydration.  She has 6-10 white cells and many bacteria.  Specimen does appear dirty with greater than 50 squamous cells; however, given her pregnancy and bacteriuria, will treat with Keflex.  Patient was able to tolerate fluids and feels much better after fluids and Zofran.  Will discharge with Zofran and Keflex.  After history, exam, and medical workup I feel the patient has been appropriately medically screened and is safe for discharge home. Pertinent diagnoses were discussed with the patient. Patient was given return precautions.  Final Clinical Impression(s) / ED Diagnoses Final diagnoses:  Vomiting affecting pregnancy  Bacteriuria during pregnancy    Rx / DC Orders ED Discharge Orders         Ordered    ondansetron (ZOFRAN ODT) 4 MG disintegrating tablet  Every 8 hours PRN        08/27/20 0323    cephALEXin (KEFLEX) 500 MG capsule  3 times daily        08/27/20 0323           Horton, Mayer Masker, MD 08/27/20 [phone]

## 2020-08-27 NOTE — Discharge Instructions (Addendum)
You were seen today for vomiting.  You were cleared by OB.  You improved with Zofran.  Your urinalysis does show some evidence of dehydration.  Also has significant bacteria.  Given your pregnancy, you will be treated with Keflex.  Follow-up closely with your OB/GYN.

## 2020-08-27 NOTE — ED Notes (Signed)
Spoke to OB RR, cleared by OBMD; EDP aware

## 2020-08-27 NOTE — ED Notes (Signed)
Fetal heart tones heard, FHR approx. 150; unable to capture on monitor; OB RR aware; will attempt again later

## 2020-08-27 NOTE — Progress Notes (Signed)
OBRR called Kaitlyn, RN about adjusting fetal monitor.

## 2020-08-28 LAB — URINE CULTURE: Culture: >=100000 — AB

## 2020-09-24 ENCOUNTER — Other Ambulatory Visit (HOSPITAL_COMMUNITY): Payer: Self-pay

## 2020-09-24 MED ORDER — ONDANSETRON 4 MG PO TBDP
ORAL_TABLET | ORAL | 0 refills | Status: DC
  Filled 2020-09-24: qty 20, 5d supply, fill #0

## 2020-09-30 ENCOUNTER — Other Ambulatory Visit (HOSPITAL_COMMUNITY): Payer: Self-pay

## 2020-10-14 ENCOUNTER — Other Ambulatory Visit (HOSPITAL_COMMUNITY): Payer: Self-pay

## 2020-11-18 ENCOUNTER — Other Ambulatory Visit: Payer: Self-pay

## 2020-11-18 ENCOUNTER — Ambulatory Visit (INDEPENDENT_AMBULATORY_CARE_PROVIDER_SITE_OTHER): Payer: Self-pay | Admitting: Pediatrics

## 2020-11-18 DIAGNOSIS — Z7681 Expectant parent(s) prebirth pediatrician visit: Secondary | ICD-10-CM

## 2020-11-23 NOTE — Progress Notes (Signed)
Prenatal counseling for impending newborn done--  Reviewed current vaccine policy and answered all questions.  1st child, Currently 37 weeks, Current complications:  none, Prenatal care initiated:  early °Z76.81 ° °

## 2020-12-04 ENCOUNTER — Inpatient Hospital Stay (HOSPITAL_COMMUNITY)
Admission: AD | Admit: 2020-12-04 | Discharge: 2020-12-06 | DRG: 807 | Disposition: A | Discharge status: Home / Self Care | Payer: No Typology Code available for payment source | Attending: Obstetrics | Admitting: Obstetrics

## 2020-12-04 ENCOUNTER — Inpatient Hospital Stay (HOSPITAL_COMMUNITY): Payer: No Typology Code available for payment source | Admitting: Anesthesiology

## 2020-12-04 ENCOUNTER — Other Ambulatory Visit: Payer: Self-pay

## 2020-12-04 ENCOUNTER — Encounter (HOSPITAL_COMMUNITY): Payer: Self-pay | Admitting: Obstetrics & Gynecology

## 2020-12-04 DIAGNOSIS — O133 Gestational [pregnancy-induced] hypertension without significant proteinuria, third trimester: Secondary | ICD-10-CM

## 2020-12-04 DIAGNOSIS — O134 Gestational [pregnancy-induced] hypertension without significant proteinuria, complicating childbirth: Secondary | ICD-10-CM | POA: Diagnosis present

## 2020-12-04 DIAGNOSIS — Z20822 Contact with and (suspected) exposure to covid-19: Secondary | ICD-10-CM | POA: Diagnosis present

## 2020-12-04 DIAGNOSIS — Z3A40 40 weeks gestation of pregnancy: Secondary | ICD-10-CM

## 2020-12-04 HISTORY — DX: Gestational (pregnancy-induced) hypertension without significant proteinuria, third trimester: O13.3

## 2020-12-04 LAB — PROTEIN / CREATININE RATIO, URINE
Creatinine, Urine: 36.3 mg/dL
Protein Creatinine Ratio: 0.28 mg/mg{Cre} — ABNORMAL HIGH (ref 0.00–0.15)
Total Protein, Urine: 10 mg/dL

## 2020-12-04 LAB — CBC
HCT: 37.8 % (ref 36.0–46.0)
HCT: 35.7 % — ABNORMAL LOW (ref 36.0–46.0)
Hemoglobin: 11.8 g/dL — ABNORMAL LOW (ref 12.0–15.0)
Hemoglobin: 11.6 g/dL — ABNORMAL LOW (ref 12.0–15.0)
MCH: 27.4 pg (ref 26.0–34.0)
MCH: 27.5 pg (ref 26.0–34.0)
MCHC: 31.2 g/dL (ref 30.0–36.0)
MCHC: 32.5 g/dL (ref 30.0–36.0)
MCV: 87.9 fL (ref 80.0–100.0)
MCV: 84.6 fL (ref 80.0–100.0)
Platelets: 204 10*3/uL (ref 150–400)
Platelets: 209 10*3/uL (ref 150–400)
RBC: 4.3 MIL/uL (ref 3.87–5.11)
RBC: 4.22 MIL/uL (ref 3.87–5.11)
RDW: 15 % (ref 11.5–15.5)
RDW: 14.7 % (ref 11.5–15.5)
WBC: 6.2 10*3/uL (ref 4.0–10.5)
WBC: 7.4 10*3/uL (ref 4.0–10.5)
nRBC: 0 % (ref 0.0–0.2)
nRBC: 0 % (ref 0.0–0.2)

## 2020-12-04 LAB — COMPREHENSIVE METABOLIC PANEL
ALT: 16 U/L (ref 0–44)
AST: 31 U/L (ref 15–41)
Albumin: 3 g/dL — ABNORMAL LOW (ref 3.5–5.0)
Alkaline Phosphatase: 133 U/L — ABNORMAL HIGH (ref 38–126)
Anion gap: 7 (ref 5–15)
BUN: 7 mg/dL (ref 6–20)
CO2: 20 mmol/L — ABNORMAL LOW (ref 22–32)
Calcium: 8.6 mg/dL — ABNORMAL LOW (ref 8.9–10.3)
Chloride: 109 mmol/L (ref 98–111)
Creatinine, Ser: 0.81 mg/dL (ref 0.44–1.00)
GFR, Estimated: >60 mL/min (ref >60)
Glucose, Bld: 70 mg/dL (ref 70–99)
Potassium: 4.3 mmol/L (ref 3.5–5.1)
Sodium: 136 mmol/L (ref 135–145)
Total Bilirubin: 0.7 mg/dL (ref 0.3–1.2)
Total Protein: 6.4 g/dL — ABNORMAL LOW (ref 6.5–8.1)

## 2020-12-04 LAB — OB RESULTS CONSOLE GBS: GBS: NEGATIVE

## 2020-12-04 LAB — RESP PANEL BY RT-PCR (FLU A&B, COVID) ARPGX2
Influenza A by PCR: NEGATIVE
Influenza B by PCR: NEGATIVE
SARS Coronavirus 2 by RT PCR: NEGATIVE

## 2020-12-04 LAB — OB RESULTS CONSOLE HIV ANTIBODY (ROUTINE TESTING): HIV: NONREACTIVE

## 2020-12-04 LAB — RPR: RPR Ser Ql: NONREACTIVE

## 2020-12-04 LAB — OB RESULTS CONSOLE RUBELLA ANTIBODY, IGM: Rubella: IMMUNE

## 2020-12-04 LAB — TYPE AND SCREEN
ABO/RH(D): B POS
Antibody Screen: NEGATIVE

## 2020-12-04 LAB — OB RESULTS CONSOLE RPR: RPR: NONREACTIVE

## 2020-12-04 LAB — OB RESULTS CONSOLE HEPATITIS B SURFACE ANTIGEN: Hepatitis B Surface Ag: NEGATIVE

## 2020-12-04 MED ORDER — PHENYLEPHRINE 40 MCG/ML (10ML) SYRINGE FOR IV PUSH (FOR BLOOD PRESSURE SUPPORT): 80.0000 ug | PREFILLED_SYRINGE | INTRAVENOUS | Status: DC | PRN

## 2020-12-04 MED ORDER — LACTATED RINGERS IV SOLN: INTRAVENOUS | Status: DC

## 2020-12-04 MED ORDER — OXYCODONE-ACETAMINOPHEN 5-325 MG PO TABS: 2.0000 | ORAL_TABLET | ORAL | Status: DC | PRN

## 2020-12-04 MED ORDER — FENTANYL-BUPIVACAINE-NACL 0.5-0.125-0.9 MG/250ML-% EP SOLN
12.0000 mL/h | EPIDURAL | Status: DC | PRN
  Administered 2020-12-04: 12 mL/h via EPIDURAL
  Filled 2020-12-04: qty 250

## 2020-12-04 MED ORDER — OXYTOCIN BOLUS FROM INFUSION
333.0000 mL | Freq: Once | INTRAVENOUS | Status: AC
  Administered 2020-12-04: 333 mL via INTRAVENOUS

## 2020-12-04 MED ORDER — BENZOCAINE-MENTHOL 20-0.5 % EX AERO: 1.0000 "application " | INHALATION_SPRAY | CUTANEOUS | Status: DC | PRN

## 2020-12-04 MED ORDER — NIFEDIPINE ER OSMOTIC RELEASE 30 MG PO TB24
30.0000 mg | ORAL_TABLET | Freq: Every day | ORAL | Status: DC
  Administered 2020-12-04 – 2020-12-05 (×2): 30 mg via ORAL
  Filled 2020-12-04 (×2): qty 1

## 2020-12-04 MED ORDER — TERBUTALINE SULFATE 1 MG/ML IJ SOLN: 0.2500 mg | Freq: Once | INTRAMUSCULAR | Status: DC | PRN

## 2020-12-04 MED ORDER — PRENATAL MULTIVITAMIN CH
1.0000 | ORAL_TABLET | Freq: Every day | ORAL | Status: DC
  Administered 2020-12-05 – 2020-12-06 (×2): 1 via ORAL
  Filled 2020-12-04 (×2): qty 1

## 2020-12-04 MED ORDER — SOD CITRATE-CITRIC ACID 500-334 MG/5ML PO SOLN: 30.0000 mL | ORAL | Status: DC | PRN

## 2020-12-04 MED ORDER — DIPHENHYDRAMINE HCL 25 MG PO CAPS: 25.0000 mg | ORAL_CAPSULE | Freq: Four times a day (QID) | ORAL | Status: DC | PRN

## 2020-12-04 MED ORDER — EPHEDRINE 5 MG/ML INJ: 10.0000 mg | INTRAVENOUS | Status: DC | PRN

## 2020-12-04 MED ORDER — ACETAMINOPHEN 325 MG PO TABS: 650.0000 mg | ORAL_TABLET | ORAL | Status: DC | PRN

## 2020-12-04 MED ORDER — DIBUCAINE (PERIANAL) 1 % EX OINT: 1.0000 "application " | TOPICAL_OINTMENT | CUTANEOUS | Status: DC | PRN

## 2020-12-04 MED ORDER — OXYCODONE HCL 5 MG PO TABS: 10.0000 mg | ORAL_TABLET | ORAL | Status: DC | PRN

## 2020-12-04 MED ORDER — COCONUT OIL OIL: 1.0000 "application " | TOPICAL_OIL | Status: DC | PRN

## 2020-12-04 MED ORDER — OXYCODONE HCL 5 MG PO TABS: 5.0000 mg | ORAL_TABLET | ORAL | Status: DC | PRN

## 2020-12-04 MED ORDER — IBUPROFEN 600 MG PO TABS
600.0000 mg | ORAL_TABLET | Freq: Four times a day (QID) | ORAL | Status: DC
  Administered 2020-12-04 – 2020-12-06 (×7): 600 mg via ORAL
  Filled 2020-12-04 (×7): qty 1

## 2020-12-04 MED ORDER — SIMETHICONE 80 MG PO CHEW: 80.0000 mg | CHEWABLE_TABLET | ORAL | Status: DC | PRN

## 2020-12-04 MED ORDER — LIDOCAINE HCL (PF) 1 % IJ SOLN: 30.0000 mL | INTRAMUSCULAR | Status: DC | PRN

## 2020-12-04 MED ORDER — DIPHENHYDRAMINE HCL 50 MG/ML IJ SOLN
12.5000 mg | INTRAMUSCULAR | Status: DC | PRN
Start: 2020-12-04 — End: 2020-12-04

## 2020-12-04 MED ORDER — LACTATED RINGERS IV SOLN: 500.0000 mL | INTRAVENOUS | Status: DC | PRN

## 2020-12-04 MED ORDER — WITCH HAZEL-GLYCERIN EX PADS: 1.0000 "application " | MEDICATED_PAD | CUTANEOUS | Status: DC | PRN

## 2020-12-04 MED ORDER — OXYTOCIN-SODIUM CHLORIDE 30-0.9 UT/500ML-% IV SOLN: 2.5000 [IU]/h | INTRAVENOUS | Status: DC

## 2020-12-04 MED ORDER — SENNOSIDES-DOCUSATE SODIUM 8.6-50 MG PO TABS
2.0000 | ORAL_TABLET | ORAL | Status: DC
  Administered 2020-12-04 – 2020-12-05 (×2): 2 via ORAL
  Filled 2020-12-04 (×2): qty 2

## 2020-12-04 MED ORDER — ONDANSETRON HCL 4 MG PO TABS: 4.0000 mg | ORAL_TABLET | ORAL | Status: DC | PRN

## 2020-12-04 MED ORDER — ONDANSETRON HCL 4 MG/2ML IJ SOLN: 4.0000 mg | INTRAMUSCULAR | Status: DC | PRN

## 2020-12-04 MED ORDER — OXYTOCIN-SODIUM CHLORIDE 30-0.9 UT/500ML-% IV SOLN
1.0000 m[IU]/min | INTRAVENOUS | Status: DC
  Administered 2020-12-04: 2 m[IU]/min via INTRAVENOUS
  Filled 2020-12-04: qty 500

## 2020-12-04 MED ORDER — OXYCODONE-ACETAMINOPHEN 5-325 MG PO TABS: 1.0000 | ORAL_TABLET | ORAL | Status: DC | PRN

## 2020-12-04 MED ORDER — LIDOCAINE HCL (PF) 1 % IJ SOLN
INTRAMUSCULAR | Status: DC | PRN
  Administered 2020-12-04 (×2): 5 mL via EPIDURAL

## 2020-12-04 MED ORDER — TETANUS-DIPHTH-ACELL PERTUSSIS 5-2.5-18.5 LF-MCG/0.5 IM SUSY: 0.5000 mL | PREFILLED_SYRINGE | Freq: Once | INTRAMUSCULAR | Status: DC

## 2020-12-04 MED ORDER — IBUPROFEN 600 MG PO TABS: 600.0000 mg | ORAL_TABLET | Freq: Four times a day (QID) | ORAL | Status: DC

## 2020-12-04 MED ORDER — ONDANSETRON HCL 4 MG/2ML IJ SOLN: 4.0000 mg | Freq: Four times a day (QID) | INTRAMUSCULAR | Status: DC | PRN

## 2020-12-04 MED ORDER — FENTANYL CITRATE (PF) 100 MCG/2ML IJ SOLN
50.0000 ug | INTRAMUSCULAR | Status: DC | PRN
  Administered 2020-12-04: 100 ug via INTRAVENOUS
  Filled 2020-12-04: qty 2

## 2020-12-04 MED ORDER — LACTATED RINGERS IV SOLN: 500.0000 mL | Freq: Once | INTRAVENOUS | Status: DC

## 2020-12-04 NOTE — H&P (Signed)
25 y.o. G1P0 @ [redacted]w[redacted]d presents from the office for IOL for  new onset gestational hypertension.  She had a routine visit today and BP noted to be 140/88 and 142/90.  She denies HA, visual changes, right upper quadrant or epigastric pain.  Otherwise has good fetal movement and no bleeding.  Pregnancy c/b: Size less than dates.  Growth US obtained on 6/3 at [redacted]w[redacted]d.  EFW was 6lb 6oz (49%)   Past Medical History:  Diagnosis Date   Allergy    Gestational hypertension w/o significant proteinuria in 3rd trimester 12/04/2020   Moderate anxiety 04/06/2019   Physical exam, annual 03/07/2019    Past Surgical History:  Procedure Laterality Date   KNEE ARTHROSCOPY WITH ANTERIOR CRUCIATE LIGAMENT (ACL) REPAIR  2013   had first 2010    OB History  Gravida Para Term Preterm AB Living  1            SAB IAB Ectopic Multiple Live Births          0    # Outcome Date GA Lbr Len/2nd Weight Sex Delivery Anes PTL Lv  1 Current             Social History   Socioeconomic History   Marital status: Married    Spouse name: Not on file   Number of children: 0   Years of education: Not on file   Highest education level: Not on file  Occupational History   Occupation: patient access specialist    Employer: [employer]  Tobacco Use   Smoking status: Never   Smokeless tobacco: Never  Vaping Use   Vaping Use: Never used  Substance and Sexual Activity   Alcohol use: Never   Drug use: Never   Sexual activity: Yes  Other Topics Concern   Not on file  Social History Narrative   Not on file   Social Determinants of Health   Financial Resource Strain: Not on file  Food Insecurity: Not on file  Transportation Needs: Not on file  Physical Activity: Not on file  Stress: Not on file  Social Connections: Not on file  Intimate Partner Violence: Not on file   Inderal [propranolol] and Dicyclomine    Prenatal Transfer Tool  Maternal Diabetes: No Genetic Screening: Declined Maternal  Ultrasounds/Referrals: Normal Fetal Ultrasounds or other Referrals:  None Maternal Substance Abuse:  No Significant Maternal Medications:  None Significant Maternal Lab Results: Group B Strep negative  ABO, Rh: --/--/PENDING (06/30 1022) Antibody: PENDING (06/30 1022) Rubella:  Immune RPR:   NR HBsAg:   negative HIV:   Negative GBS:   Negative    Vitals:   12/04/20 1046  BP: (!) 139/95  Pulse: 79  Resp: 18  Temp: 98.2 F (36.8 C)     General:  NAD Abdomen:  soft, gravid, EFW 7# Ex:  no edema SVE:  3/70/-2 FHTs:  145, moderate variability, + accelerations, category 1 Toco:  irregular q8-10 min   A/P   25 y.o. G1P0 [redacted]w[redacted]d presents with IOL for GHTN at term GHTN: BPs mild range.  Will check labs IOL:  cervix is favorable, start pitocin Growth [place] 4 weeks ago with EFW 6lb6oz.  Would extrapolate current EFW 8.5#, but by leopolds today feels to be much closer to 6.5-7#.    FSR/ vtx/ GBS negative  Higinio Grow GEFFEL Farris Geiman

## 2020-12-04 NOTE — Anesthesia Procedure Notes (Signed)
Epidural Patient location during procedure: OB Start time: 12/04/2020 5:29 PM End time: 12/04/2020 5:44 PM  Staffing Anesthesiologist: Heather Roberts, MD Performed: anesthesiologist   Preanesthetic Checklist Completed: patient identified, IV checked, site marked, risks and benefits discussed, monitors and equipment checked, pre-op evaluation and timeout performed  Epidural Patient position: sitting Prep: DuraPrep Patient monitoring: heart rate, cardiac monitor, continuous pulse ox and blood pressure Approach: midline Location: L2-L3 Injection technique: LOR saline  Needle:  Needle type: Tuohy  Needle gauge: 17 G Needle length: 9 cm Needle insertion depth: 5 cm Catheter size: 20 Guage Catheter at skin depth: 10 cm Test dose: negative and Other  Assessment Events: blood not aspirated, injection not painful, no injection resistance and negative IV test  Additional Notes Informed consent obtained prior to proceeding including risk of failure, 1% risk of PDPH, risk of minor discomfort and bruising.  Discussed rare but serious complications including epidural abscess, permanent nerve injury, epidural hematoma.  Discussed alternatives to epidural analgesia and patient desires to proceed.  Timeout performed pre-procedure verifying patient name, procedure, and platelet count.  Patient tolerated procedure well.

## 2020-12-04 NOTE — Lactation Note (Signed)
This note was copied from a baby's chart. Lactation Consultation Note  Patient Name: Cheryl Perkins GYBWL'S Date: 12/04/2020 Reason for consult: L&D Initial assessment Age:25 hours  L&D consult with >60 minutes old infant and P1 mother. Parents are present at time of consult. Congratulated them on their newborn. Discussed STS as ideal transition for infants after birth helping with temperature, blood sugar and comfort. Talked about primal reflexes such as rooting, hands to mouth, searching for the breast among others.   LC assisted with latch. Explained LC services availability during postpartum stay. Thanked family for their time.    Maternal Data Has patient been taught Hand Expression?: Yes Does the patient have breastfeeding experience prior to this delivery?: No  Feeding Mother's Current Feeding Choice: Breast Milk  LATCH Score Latch: Grasps breast easily, tongue down, lips flanged, rhythmical sucking.  Audible Swallowing: Spontaneous and intermittent  Type of Nipple: Everted at rest and after stimulation  Comfort (Breast/Nipple): Soft / non-tender  Hold (Positioning): Assistance needed to correctly position infant at breast and maintain latch.  LATCH Score: 9  Interventions Interventions: Breast feeding basics reviewed;Assisted with latch;Skin to skin;Hand express;Expressed milk;Education  Discharge Pump: Personal WIC Program: No  Consult Status Consult Status: Follow-up Date: 12/04/20 Follow-up type: In-patient    [name] [name] 12/04/2020, 7:32 PM

## 2020-12-04 NOTE — Progress Notes (Signed)
Patient seen and examined.  Some mild discomfort with contractions  BP 139/89   Pulse 90   Temp 98 F (36.7 C) (Oral)   Resp 16   Ht 5' (1.524 m)   Wt 66.2 kg   LMP 04/07/2020   BMI 28.51 kg/m   Toco:  irregular q2 to 5 minutes EFM:135-140s, moderate variability, + scalp stimulation and + accelerations SVE: 4/70/-2, AROM moderate yellow fluid, ? If light meconium  A/p: G1 @ [redacted]w[redacted]d with IOL for GHTN at term GHTN:  labs normal, BPs have remained mild range IOL: continue induction with pitocin

## 2020-12-04 NOTE — Anesthesia Preprocedure Evaluation (Signed)
Anesthesia Evaluation  Patient identified by MRN, date of birth, ID band Patient awake    Reviewed: Allergy & Precautions, NPO status , Patient's Chart, lab work & pertinent test results  History of Anesthesia Complications Negative for: history of anesthetic complications  Airway Mallampati: II  TM Distance: >3 FB     Dental no notable dental hx. (+) Dental Advisory Given   Pulmonary neg pulmonary ROS,    Pulmonary exam normal        Cardiovascular hypertension, Pt. on medications Normal cardiovascular exam     Neuro/Psych PSYCHIATRIC DISORDERS Anxiety negative neurological ROS     GI/Hepatic negative GI ROS, Neg liver ROS,   Endo/Other  negative endocrine ROS  Renal/GU negative Renal ROS  negative genitourinary   Musculoskeletal negative musculoskeletal ROS (+)   Abdominal   Peds negative pediatric ROS (+)  Hematology negative hematology ROS (+)   Anesthesia Other Findings   Reproductive/Obstetrics negative OB ROS                             Anesthesia Physical Anesthesia Plan  ASA: 2  Anesthesia Plan: Epidural   Post-op Pain Management:    Induction:   PONV Risk Score and Plan:   Airway Management Planned: Natural Airway  Additional Equipment:   Intra-op Plan:   Post-operative Plan:   Informed Consent: I have reviewed the patients History and Physical, chart, labs and discussed the procedure including the risks, benefits and alternatives for the proposed anesthesia with the patient or authorized representative who has indicated his/her understanding and acceptance.     Dental advisory given  Plan Discussed with: Anesthesiologist  Anesthesia Plan Comments:         Anesthesia Quick Evaluation

## 2020-12-04 NOTE — Progress Notes (Signed)
Patient came to [facility] having blood pressure issues. Blood pressure was 151/75 on admission on the left arm and 142/82 on the right arm. Patient was having issues in L&D according to chart and L&D charge nurse that brought patient to unit. The charge nurse stated in report that she had not gotten anything for her blood pressure in L&D.   RN called Dr. Chestine Spore to inform of blood pressure trends and Dr. Chestine Spore gave verbal order for Procardia XL 30mg  once daily and to include dose now. RN will continue to monitor patients blood pressure and give medication prescribed.

## 2020-12-05 LAB — CBC
HCT: 33.7 % — ABNORMAL LOW (ref 36.0–46.0)
Hemoglobin: 10.9 g/dL — ABNORMAL LOW (ref 12.0–15.0)
MCH: 27.7 pg (ref 26.0–34.0)
MCHC: 32.3 g/dL (ref 30.0–36.0)
MCV: 85.5 fL (ref 80.0–100.0)
Platelets: 194 10*3/uL (ref 150–400)
RBC: 3.94 MIL/uL (ref 3.87–5.11)
RDW: 14.8 % (ref 11.5–15.5)
WBC: 11.6 10*3/uL — ABNORMAL HIGH (ref 4.0–10.5)
nRBC: 0 % (ref 0.0–0.2)

## 2020-12-05 MED ORDER — NIFEDIPINE ER OSMOTIC RELEASE 30 MG PO TB24
60.0000 mg | ORAL_TABLET | Freq: Every day | ORAL | Status: DC
  Administered 2020-12-06: 60 mg via ORAL
  Filled 2020-12-05: qty 2

## 2020-12-05 MED ORDER — NIFEDIPINE ER OSMOTIC RELEASE 30 MG PO TB24
30.0000 mg | ORAL_TABLET | Freq: Once | ORAL | Status: AC
  Administered 2020-12-05: 30 mg via ORAL
  Filled 2020-12-05: qty 1

## 2020-12-05 NOTE — Lactation Note (Signed)
This note was copied from a baby's chart. Lactation Consultation Note Attempted to see mom. Mom stated she tried about 1 hr ago to latch and baby wasn't interested. LC asked mom if she would like for me to come back in another hour mom stated yes.  Patient Name: Cheryl Perkins ZSMOL'M Date: 12/05/2020   Age:25 hours  Maternal Data    Feeding    LATCH Score                    Lactation Tools Discussed/Used    Interventions    Discharge    Consult Status      [name] 12/05/2020, 2:42 AM

## 2020-12-05 NOTE — Lactation Note (Signed)
This note was copied from a baby's chart. Lactation Consultation Note Mom stated she is having trouble latching. Mom had on shells. Suggested mom didn't wear them at night but put them on in the morning. Mom has hand pump for pre-pumping. Mom got short shaft nipple everted.  In football position latched baby. Baby had wide open flange. Baby suckled several times, then off and on. Suckled well then stopped. Patient Name: Cheryl Perkins [id number] Date: 12/05/2020 Reason for consult: Initial assessment;Primapara;Term Age:29 hours  Maternal Data Has patient been taught Hand Expression?: Yes Does the patient have breastfeeding experience prior to this delivery?: No  Feeding    LATCH Score Latch: Repeated attempts needed to sustain latch, nipple held in mouth throughout feeding, stimulation needed to elicit sucking reflex.  Audible Swallowing: None  Type of Nipple: Everted at rest and after stimulation (semi flat/very short shaft/everts w/stimulation)  Comfort (Breast/Nipple): Soft / non-tender  Hold (Positioning): Full assist, staff holds infant at breast  LATCH Score: 5   Lactation Tools Discussed/Used Tools: Pump;Shells Breast pump type: Manual  Interventions Interventions: Breast feeding basics reviewed;Support pillows;Assisted with latch;Position options;Skin to skin;Breast massage;Hand express;Shells;Pre-pump if needed;Hand pump;Breast compression;Adjust position  Discharge [facility] Program: No  Consult Status Consult Status: Follow-up Date: 12/05/20 Follow-up type: In-patient    [name] 12/05/2020, 4:56 AM

## 2020-12-05 NOTE — Anesthesia Postprocedure Evaluation (Signed)
Anesthesia Post Note  Patient: Cheryl Perkins  Procedure(s) Performed: AN AD HOC LABOR EPIDURAL     Patient location during evaluation: Mother Baby Anesthesia Type: Epidural Level of consciousness: awake and alert Pain management: pain level controlled Vital Signs Assessment: post-procedure vital signs reviewed and stable Respiratory status: spontaneous breathing, nonlabored ventilation and respiratory function stable Cardiovascular status: stable Postop Assessment: no headache, no backache and epidural receding Anesthetic complications: no   No notable events documented.  Last Vitals:  Vitals:   12/05/20 0920 12/05/20 1314  BP: 134/90 140/84  Pulse: 80 95  Resp: 18 18  Temp: 36.9 C 36.7 C  SpO2:      Last Pain:  Vitals:   12/05/20 1314  TempSrc: Oral  PainSc:                  Nelle Don Sherri Mcarthy

## 2020-12-05 NOTE — Anesthesia Postprocedure Evaluation (Signed)
Anesthesia Post Note  Patient: Kemaya Brandstetter  Procedure(s) Performed: AN AD HOC LABOR EPIDURAL     Patient location during evaluation: Mother Baby Anesthesia Type: Epidural Level of consciousness: awake, oriented and awake and alert Pain management: pain level controlled Vital Signs Assessment: post-procedure vital signs reviewed and stable Respiratory status: spontaneous breathing, respiratory function stable and nonlabored ventilation Cardiovascular status: stable Postop Assessment: no headache, adequate PO intake, able to ambulate, no backache, no apparent nausea or vomiting and patient able to bend at knees Anesthetic complications: no   No notable events documented.  Last Vitals:  Vitals:   12/05/20 0300 12/05/20 0500  BP: 134/86 137/80  Pulse: 88 87  Resp: 17 17  Temp: 37.1 C   SpO2: 100%     Last Pain:  Vitals:   12/05/20 0720  TempSrc:   PainSc: 0-No pain   Pain Goal:                   Tiffannie Sloss

## 2020-12-05 NOTE — Social Work (Signed)
CSW received consult for hx of Anxiety.  CSW met with MOB to offer support and complete assessment.     CSW introduced self and role. CSW observed infant 'Cheryl Perkins' in bassinet and FOB Cheryl Perkins also present. CSW was provided permission to complete assessment with FOB present. CSW informed MOB of reason for assessment. MOB was understanding and disclosed she was diagnosed with anxiety in 2020. MOB reported she is currently doing well and had a good pregnancy. MOB denies experiencing any symptoms during pregnancy. MOB stated she was taking Zoloft a little over a year ago, which she found to be somewhat helpful. MOB shared she also attended therapy two years ago to treat the symptoms. CSW asked MOB if she identifies any coping skills that are helpful. MOB laughed as she stated biting her nails help with coping. MOB identified FOB and her mother as primary supports. MOB denies any current SI or HI.   CSW provided education regarding the baby blues period versus PPD and provided resources. CSW provided the New Mom Checklist and encouraged MOB to self evaluate and contact a medical professional if symptoms are noted at any time.   CSW provided review of Sudden Infant Death Syndrome (SIDS) precautions. MOB has all essentials for infant. MOB identified [facility] for follow-up care and denies barriers. MOB reported she has no additional needs at this time.  CSW identifies no further need for intervention and no barriers to discharge at this time.  Cheryl Perkins, LCSWA Clinical Social Work [facility] [phone] 

## 2020-12-05 NOTE — Progress Notes (Signed)
Post Partum Day 1 Subjective: no complaints, up ad lib, voiding, and tolerating PO. Denies HA/VC/RUQ pain/SOB  Objective: Patient Vitals for the past 24 hrs:  BP Temp Temp src Pulse Resp SpO2 Height Weight  12/05/20 0500 137/80 -- -- 87 17 -- -- --  12/05/20 0300 134/86 98.7 F (37.1 C) Oral 88 17 100 % -- --  12/05/20 0100 134/81 -- -- 86 17 -- -- --  12/04/20 2309 131/75 99.3 F (37.4 C) Oral 94 16 99 % -- --  12/04/20 2307 (!) 142/73 -- -- (!) 106 -- 99 % -- --  12/04/20 2122 (!) 144/82 -- -- 99 17 100 % -- --  12/04/20 2009 (!) 142/82 98.4 F (36.9 C) Oral 74 17 100 % -- --  12/04/20 2007 (!) 151/75 -- -- 74 -- -- -- --  12/04/20 2001 (!) 142/90 -- -- (!) 107 -- -- -- --  12/04/20 1932 (!) 153/91 -- -- 73 -- -- -- --  12/04/20 1917 (!) 164/89 -- -- 56 -- -- -- --  12/04/20 1902 (!) 157/83 -- -- 81 18 -- -- --  12/04/20 1846 (!) 148/98 -- -- 82 -- -- -- --  12/04/20 1835 (!) 146/105 -- -- 89 -- -- -- --  12/04/20 1756 (!) 155/92 -- -- 88 -- -- -- --  12/04/20 1751 (!) 158/93 -- -- 76 18 -- -- --  12/04/20 1746 (!) 143/95 -- -- 73 -- -- -- --  12/04/20 1741 (!) 159/103 -- -- (!) 102 -- -- -- --  12/04/20 1737 (!) 159/88 -- -- 95 -- -- -- --  12/04/20 1736 (!) 157/87 -- -- 94 -- -- -- --  12/04/20 1645 (!) 141/93 98.2 F (36.8 C) Oral 84 16 -- -- --  12/04/20 1613 (!) 144/83 -- -- 86 18 -- -- --  12/04/20 1542 (!) 137/91 -- -- 76 -- -- -- --  12/04/20 1512 (!) 154/97 -- -- 77 18 -- -- --  12/04/20 1434 139/89 98 F (36.7 C) Oral 90 16 -- -- --  12/04/20 1404 (!) 149/96 -- -- 72 18 -- -- --  12/04/20 1319 (!) 142/100 -- -- 71 16 -- -- --  12/04/20 1238 (!) 141/85 -- -- 88 16 -- -- --  12/04/20 1205 (!) 141/75 -- -- 96 16 -- -- --  12/04/20 1123 (!) 141/95 -- -- 81 16 -- -- --  12/04/20 1046 (!) 139/95 98.2 F (36.8 C) Oral 79 18 -- -- --  12/04/20 1022 -- -- -- -- -- -- 5' (1.524 m) 66.2 kg    Physical Exam:  General: alert and no distress Lochia: appropriate Uterine  Fundus: firm DVT Evaluation: No evidence of DVT seen on physical exam.  Recent Labs    12/04/20 1032 12/04/20 1526 12/05/20 0530  WBC 6.2 7.4 11.6*  HGB 11.8* 11.6* 10.9*  HCT 37.8 35.7* 33.7*  PLT 204 209 194    Recent Labs    12/04/20 1032  NA 136  K 4.3  CL 109  BUN 7  CREATININE 0.81  GLUCOSE 70  BILITOT 0.7  ALT 16  AST 31  ALKPHOS 133*  PROT 6.4*  ALBUMIN 3.0*    Recent Labs    12/04/20 1032  CALCIUM 8.6*   Assessment/Plan: Plan for discharge tomorrow  Dessirae Tolin 25 y.o. G1P1001 PPD#1 sp SVD at [redacted]w[redacted]d IOL for GHTN 1. PPC: No lacs, EBL 50cc, Hgb 11.8>10.9. Rubella Immune, blood type B POS, breastfeeding, baby girl in  room. Vaccines: tdap completed in pregnancy, declines COVID series  2. GHTN: isolated severe range after delivery, started procardia 30mg  xL PPD#0, continue to monitor 3. Anxiety on no meds    LOS: 1 day   Braycen Burandt K Taam-Akelman 12/05/2020, 10:06 AM

## 2020-12-05 NOTE — Lactation Note (Signed)
This note was copied from a baby's chart. Lactation Consultation Note LC heard baby kept crying. LC went back to room.baby isn't settling and cueing. Gave baby to mom to place on Rt. Breast. Mom used hand pump for pre-pumping. In football hold baby latched.  Patient Name: Cheryl Perkins [id number] Date: 12/05/2020 Reason for consult: Follow-up assessment;Primapara;Term Age:25 hours  Maternal Data Has patient been taught Hand Expression?: Yes Does the patient have breastfeeding experience prior to this delivery?: No  Feeding    LATCH Score Latch: Repeated attempts needed to sustain latch, nipple held in mouth throughout feeding, stimulation needed to elicit sucking reflex.  Audible Swallowing: None  Type of Nipple: Everted at rest and after stimulation (semi flat/very short shaft/everts w/stimulation)  Comfort (Breast/Nipple): Soft / non-tender  Hold (Positioning): Full assist, staff holds infant at breast  LATCH Score: 5   Lactation Tools Discussed/Used Tools: Pump Breast pump type: Manual  Interventions Interventions: Breast feeding basics reviewed;Assisted with latch;Position options;Skin to skin;Breast massage;Breast compression;Adjust position;Support pillows  Discharge WIC Program: No  Consult Status Consult Status: Follow-up Date: 12/05/20 Follow-up type: In-patient    [name] 12/05/2020, 5:12 AM

## 2020-12-06 MED ORDER — ACETAMINOPHEN 325 MG PO TABS: 650.0000 mg | ORAL_TABLET | ORAL | 1 refills | Status: DC | PRN

## 2020-12-06 MED ORDER — IBUPROFEN 600 MG PO TABS: 600.0000 mg | ORAL_TABLET | Freq: Four times a day (QID) | ORAL | 1 refills | Status: DC

## 2020-12-06 MED ORDER — NIFEDIPINE ER 60 MG PO TB24: 60.0000 mg | ORAL_TABLET | Freq: Every day | ORAL | 1 refills | Status: DC

## 2020-12-06 NOTE — Lactation Note (Signed)
This note was copied from a baby's chart. Lactation Consultation Note  Patient Name: Cheryl Perkins [id number] Date: 12/06/2020 Reason for consult: Follow-up assessment;Primapara;Difficult latch;Term Age:25 hours  Despite multiple attempts, infant has not likely fed since around midnight. We attempted to latch infant with Cheryl Perkins in a side-lying position and sitting up, but without success. Infant became increasingly fussy & it was apparent that she needed to be fed/supplemented. Parents gave permission for formula. Infant did well with the extra slow-flow bottle nipple (the slow flow nipple was attempted, initially, but was too fast).   Hand expression was taught to Cheryl Perkins & she readily expressed colostrum. Cheryl Perkins reports that her breasts feel heavier today.   Cheryl Perkins has a Spectra pump at home. Size 21 flange was provided for her hand pump and fit well. Cheryl Perkins is aware she will likely need to buy size 21 flanges for her Spectra.    I was speaking with parents about trying a nipple shield, as Cheryl Perkins's nipples are short-shafted and infant cannot grasp well enough to latch. Pediatric NP walked into room; I will return to room to continue conversation.   Lurline Hare [facility] 12/06/2020, 11:45 AM

## 2020-12-06 NOTE — Plan of Care (Signed)
  Problem: Education: Goal: Knowledge of General Education information will improve Description: Including pain rating scale, medication(s)/side effects and non-pharmacologic comfort measures Outcome: Completed/Met   Problem: Clinical Measurements: Goal: Ability to maintain clinical measurements within normal limits will improve Outcome: Completed/Met Goal: Will remain free from infection Outcome: Completed/Met Goal: Diagnostic test results will improve Outcome: Completed/Met Goal: Respiratory complications will improve Outcome: Completed/Met Goal: Cardiovascular complication will be avoided Outcome: Completed/Met   Problem: Activity: Goal: Risk for activity intolerance will decrease Outcome: Completed/Met   Problem: Nutrition: Goal: Adequate nutrition will be maintained Outcome: Completed/Met   Problem: Elimination: Goal: Will not experience complications related to bowel motility Outcome: Completed/Met Goal: Will not experience complications related to urinary retention Outcome: Completed/Met

## 2020-12-06 NOTE — Lactation Note (Signed)
This note was copied from a baby's chart. Lactation Consultation Note  Patient Name: Cheryl Perkins [id number] Date: 12/06/2020 Reason for consult: Follow-up assessment;Term;Primapara;Difficult latch Age:25 hours  A size 24 nipple shield was used & prefilled with a small amount of formula. Infant latched with ease & Mom was comfortable with latch. Swallows verified by cervical auscultation.   Mom to pump 4-6 times/day; an extra size 24 nipple shield was provided along with coconut oil for pump flanges & for self-care after nursing. Mom has shells to wear to protect nipples from rubbing of fabric, etc.  Mom's R nipple does have a very small (blood blister in the center of her nipple). I anticipate that will resolve quickly.   Parents' questions answered to their satisfaction.   Lurline Hare [facility] 12/06/2020, 12:30 PM

## 2020-12-06 NOTE — Lactation Note (Signed)
This note was copied from a baby's chart. Lactation Consultation Note Baby had started cluster feeding. Mom stated the baby has been gassy. Mom asked for a bottle. Mom gave formula. Asked mom if she had questions. She did and LC answered them.  Attempted to latch baby. Baby wouldn't latch at this time. Baby burped and spit up 2 times small amount of formula.  Newborn feeding habits discussed. Mom had given pacifier. Discouraged for 2 weeks. Lactation brochure given. Encouraged to call for assistance or further questions.  Patient Name: [name] [id number] Date: 12/06/2020 Reason for consult: Initial assessment;Primapara;Term Age:31 hours  Maternal Data Has patient been taught Hand Expression?: Yes Does the patient have breastfeeding experience prior to this delivery?: No  Feeding    LATCH Score Latch: Grasps breast easily, tongue down, lips flanged, rhythmical sucking.  Audible Swallowing: None  Type of Nipple: Everted at rest and after stimulation  Comfort (Breast/Nipple): Filling, red/small blisters or bruises, mild/mod discomfort (bruised)  Hold (Positioning): Assistance needed to correctly position infant at breast and maintain latch.  LATCH Score: 6   Lactation Tools Discussed/Used Tools: Pump;Other (comment) (personal DEBP) Breast pump type: Double-Electric Breast Pump  Interventions Interventions: Breast feeding basics reviewed;Support pillows;Assisted with latch;Position options;Skin to skin;Expressed milk;Breast massage  Discharge Pump: Personal;DEBP WIC Program: No  Consult Status Consult Status: Follow-up Date: 12/06/20 Follow-up type: In-patient    [name] 12/06/2020, 6:51 AM

## 2020-12-06 NOTE — Progress Notes (Signed)
Post Partum Day 2 Subjective: no complaints, up ad lib, voiding, and tolerating PO. Denies HA/VC/RUQ pain/SOB  Objective: Patient Vitals for the past 24 hrs:  BP Temp Temp src Pulse Resp SpO2  12/06/20 0500 128/78 98.2 F (36.8 C) Oral 79 18 --  12/05/20 2119 132/86 98.3 F (36.8 C) Oral 82 18 100 %  12/05/20 1314 140/84 98.1 F (36.7 C) Oral 95 18 --  12/05/20 0920 134/90 98.4 F (36.9 C) Oral 80 18 --    Physical Exam:  General: alert and no distress Lochia: appropriate Uterine Fundus: firm DVT Evaluation: No evidence of DVT seen on physical exam.  Recent Labs    12/04/20 1032 12/04/20 1526 12/05/20 0530  WBC 6.2 7.4 11.6*  HGB 11.8* 11.6* 10.9*  HCT 37.8 35.7* 33.7*  PLT 204 209 194    Recent Labs    12/04/20 1032  NA 136  K 4.3  CL 109  BUN 7  CREATININE 0.81  GLUCOSE 70  BILITOT 0.7  ALT 16  AST 31  ALKPHOS 133*  PROT 6.4*  ALBUMIN 3.0*    Recent Labs    12/04/20 1032  CALCIUM 8.6*   Assessment/Plan: Discharge home  Mika Kosta 25 y.o. G1P1001 PPD#2 sp SVD at [redacted]w[redacted]d IOL for GHTN 1. PPC: No lacs, EBL 50cc, Hgb 11.8>10.9. Rubella Immune, blood type B POS, breastfeeding, baby girl in room. Vaccines: tdap completed in pregnancy, declines COVID series  2. GHTN: isolated severe range after delivery, started procardia 30mg  xL PPD#0, increased to 60mg  PPD#1, monitor BP until noon, if wnl will discharge home. Discussed home BP monitoring and f/u this week for BP check 3. Anxiety on no meds    LOS: 2 days   Lenorris Karger K Taam-Akelman 12/06/2020, 8:01 AM

## 2020-12-06 NOTE — Discharge Instructions (Signed)
Check your blood pressure at home 2 times a day. If >140/90, call, if >160/110 you will likely need to be seen same day

## 2020-12-06 NOTE — Discharge Summary (Signed)
Postpartum Discharge Summary    Patient Name: Cheryl Perkins DOB: 1996-02-28 MRN: [id number]  Date of admission: 12/04/2020 Delivery date:12/04/2020  Delivering provider: Jerelyn Charles  Date of discharge: 12/06/2020  Admitting diagnosis: Gestational hypertension w/o significant proteinuria in 3rd trimester [O13.3] Intrauterine pregnancy: [redacted]w[redacted]d    Secondary diagnosis:  Active Problems:   Gestational hypertension w/o significant proteinuria in 3rd trimester   SVD (spontaneous vaginal delivery)  Additional problems: none    Discharge diagnosis: Term Pregnancy Delivered and Gestational Hypertension                                              Post partum procedures: none Augmentation: AROM and Pitocin Complications: None  Hospital course: Induction of Labor With Vaginal Delivery   25y.o. yo G1P1001 at 421w1das admitted to the hospital 12/04/2020 for induction of labor.  Indication for induction: Gestational hypertension.  Patient had an uncomplicated labor course as follows: Membrane Rupture Time/Date: 3:05 PM ,12/04/2020   Delivery Method:Vaginal, Spontaneous  Episiotomy: None  Lacerations:  None  Details of delivery can be found in separate delivery note.  Patient didn't require antihypertensives on L&D but due to elevated BP postpartum she was started on procardia, increased to 6080mD. Patient otherwise had a routine postpartum course. Patient is discharged home 12/06/20.  Newborn Data: Birth date:12/04/2020  Birth time:6:19 PM  Gender:Female  Living status:Living  Apgars:9 ,9  Weight:3045 g   Magnesium Sulfate received: No BMZ received: No Rhophylac:N/A MMR:N/A T-DaP:Given prenatally Transfusion:No  Physical exam  Vitals:   12/05/20 1314 12/05/20 2119 12/06/20 0500 12/06/20 1100  BP: 140/84 132/86 128/78 129/84  Pulse: 95 82 79 77  Resp: _0 Temp: 98.1 F (36.7 C) 98.3 F (36.8 C) 98.2 F (36.8 C)   TempSrc: Oral Oral Oral   SpO2:  100%    Weight:       Height:       General: alert and no distress Lochia: appropriate Uterine Fundus: firm DVT Evaluation: No evidence of DVT seen on physical exam. Labs: Lab Results  Component Value Date   WBC 11.6 (H) 12/05/2020   HGB 10.9 (L) 12/05/2020   HCT 33.7 (L) 12/05/2020   MCV 85.5 12/05/2020   PLT 194 12/05/2020   CMP Latest Ref Rng & Units 12/04/2020  Glucose 70 - 99 mg/dL 70  BUN 6 - 20 mg/dL 7  Creatinine 0.44 - 1.00 mg/dL 0.81  Sodium 135 - 145 mmol/L 136  Potassium 3.5 - 5.1 mmol/L 4.3  Chloride 98 - 111 mmol/L 109  CO2 22 - 32 mmol/L 20(L)  Calcium 8.9 - 10.3 mg/dL 8.6(L)  Total Protein 6.5 - 8.1 g/dL 6.4(L)  Total Bilirubin 0.3 - 1.2 mg/dL 0.7  Alkaline Phos 38 - 126 U/L 133(H)  AST 15 - 41 U/L 31  ALT 0 - 44 U/L 16   Edinburgh Score: Edinburgh Postnatal Depression Scale Screening Tool 12/05/2020  I have been able to laugh and see the funny side of things. 0  I have looked forward with enjoyment to things. 0  I have blamed myself unnecessarily when things went wrong. 1  I have been anxious or worried for no good reason. 1  I have felt scared or panicky for no good reason. 1  Things have been getting on top of me. 0  I have  been so unhappy that I have had difficulty sleeping. 0  I have felt sad or miserable. 0  I have been so unhappy that I have been crying. 0  The thought of harming myself has occurred to me. 0  Edinburgh Postnatal Depression Scale Total 3      After visit meds:  Allergies as of 12/06/2020       Reactions   Inderal [propranolol] Hives        Medication List     STOP taking these medications    buPROPion 150 MG 24 hr tablet Commonly known as: Wellbutrin XL   cephALEXin 500 MG capsule Commonly known as: KEFLEX   clonazePAM 0.5 MG tablet Commonly known as: KLONOPIN   ondansetron 4 MG disintegrating tablet Commonly known as: Zofran ODT   promethazine 25 MG tablet Commonly known as: PHENERGAN   sertraline 50 MG tablet Commonly known  as: ZOLOFT   SM Aspirin Adult Low Strength 81 MG EC tablet Generic drug: aspirin       TAKE these medications    acetaminophen 325 MG tablet Commonly known as: Tylenol Take 2 tablets (650 mg total) by mouth every 4 (four) hours as needed (for pain scale < 4).   ibuprofen 600 MG tablet Commonly known as: ADVIL Take 1 tablet (600 mg total) by mouth every 6 (six) hours.   NIFEdipine 60 MG 24 hr tablet Commonly known as: ADALAT CC Take 1 tablet (60 mg total) by mouth daily.   prenatal multivitamin Tabs tablet Take 1 tablet by mouth daily at 12 noon.        Discharge home in stable condition Infant Feeding: Breast Infant Disposition:home with mother Discharge instruction: per After Visit Summary and Postpartum booklet. Activity: Advance as tolerated. Pelvic rest for 6 weeks.  Diet: routine diet Anticipated Birth Control: Depo Postpartum Appointment:4 weeks Additional Postpartum F/U: BP check 1 week Future Appointments:No future appointments. Follow up Visit:  Follow-up Information     Taam-Akelman, Lawrence Santiago, MD Follow up.   Specialty: Obstetrics and Gynecology Why: Blood pressure check within 1 week Contact information: [address] [place] [address] [phone]         Jerelyn Charles, MD Follow up in 4 week(s).   Specialty: Obstetrics Why: Postpartum visit in 4 weeks Contact information: [address] [place] [address] [phone]                     12/06/2020 Jonelle Sidle, MD

## 2020-12-06 NOTE — Progress Notes (Signed)
Patient stated she has not received the Covid vaccination. Patient declined the Covid vaccination. Patient has had the TDAP.

## 2020-12-09 ENCOUNTER — Other Ambulatory Visit (HOSPITAL_COMMUNITY): Payer: Self-pay

## 2020-12-09 MED ORDER — NIFEDIPINE ER OSMOTIC RELEASE 60 MG PO TB24
60.0000 mg | ORAL_TABLET | Freq: Every day | ORAL | 0 refills | Status: DC
  Filled 2020-12-09 (×2): qty 30, 30d supply, fill #0

## 2020-12-14 NOTE — Addendum Note (Signed)
Addendum  created 12/14/20 1956 by Atilano Median, DO   Intraprocedure Staff edited

## 2020-12-15 ENCOUNTER — Telehealth (HOSPITAL_COMMUNITY): Payer: Self-pay | Admitting: *Deleted

## 2020-12-15 NOTE — Telephone Encounter (Signed)
Mom reports doing great! BP recheck with OB last week was good. EPDS = 4 (Hospital score was 3) No concerns about herself. Mom reports baby is doing well. Had a peds appt this morning and everything looked good. Exclusively pumping. No concerns. with baby. Duffy Rhody, RN 12/15/2020 at 1:45pm

## 2021-02-10 ENCOUNTER — Other Ambulatory Visit (HOSPITAL_COMMUNITY): Payer: Self-pay

## 2021-02-10 MED ORDER — QUICKVUE AT-HOME COVID-19 TEST VI KIT
PACK | 0 refills | Status: DC
  Filled 2021-02-10: qty 2, 2d supply, fill #0

## 2021-03-12 ENCOUNTER — Other Ambulatory Visit (HOSPITAL_COMMUNITY): Payer: Self-pay

## 2021-03-12 MED ORDER — SERTRALINE HCL 25 MG PO TABS
25.0000 mg | ORAL_TABLET | Freq: Every day | ORAL | 1 refills | Status: DC
  Filled 2021-03-12: qty 30, 30d supply, fill #0

## 2021-03-24 ENCOUNTER — Other Ambulatory Visit (HOSPITAL_COMMUNITY): Payer: Self-pay

## 2021-03-24 MED ORDER — MEDROXYPROGESTERONE ACETATE 150 MG/ML IM SUSY
PREFILLED_SYRINGE | INTRAMUSCULAR | 4 refills | Status: DC
  Filled 2021-03-24: qty 1, 90d supply, fill #0
  Filled 2021-04-09 – 2021-07-08 (×2): qty 1, 84d supply, fill #0

## 2021-03-26 ENCOUNTER — Other Ambulatory Visit (HOSPITAL_COMMUNITY): Payer: Self-pay

## 2021-03-26 MED ORDER — SERTRALINE HCL 50 MG PO TABS
50.0000 mg | ORAL_TABLET | Freq: Every day | ORAL | 1 refills | Status: DC
  Filled 2021-03-26 – 2021-04-09 (×2): qty 30, 30d supply, fill #0
  Filled 2021-05-14: qty 30, 30d supply, fill #1

## 2021-04-01 ENCOUNTER — Other Ambulatory Visit (HOSPITAL_COMMUNITY): Payer: Self-pay

## 2021-04-03 ENCOUNTER — Other Ambulatory Visit (HOSPITAL_COMMUNITY): Payer: Self-pay

## 2021-04-09 ENCOUNTER — Other Ambulatory Visit (HOSPITAL_COMMUNITY): Payer: Self-pay

## 2021-04-10 ENCOUNTER — Other Ambulatory Visit (HOSPITAL_COMMUNITY): Payer: Self-pay

## 2021-04-10 MED ORDER — SERTRALINE HCL 50 MG PO TABS
50.0000 mg | ORAL_TABLET | Freq: Every day | ORAL | 1 refills | Status: DC
  Filled 2021-04-10: qty 90, 90d supply, fill #0

## 2021-05-14 ENCOUNTER — Other Ambulatory Visit (HOSPITAL_COMMUNITY): Payer: Self-pay

## 2021-06-05 ENCOUNTER — Other Ambulatory Visit (HOSPITAL_COMMUNITY): Payer: Self-pay

## 2021-06-05 MED ORDER — CARESTART COVID-19 HOME TEST VI KIT
PACK | 0 refills | Status: DC
  Filled 2021-06-05: qty 4, 4d supply, fill #0

## 2021-06-23 ENCOUNTER — Other Ambulatory Visit (HOSPITAL_COMMUNITY): Payer: Self-pay

## 2021-06-23 ENCOUNTER — Other Ambulatory Visit: Payer: Self-pay

## 2021-06-23 MED ORDER — SERTRALINE HCL 50 MG PO TABS
50.0000 mg | ORAL_TABLET | Freq: Every day | ORAL | 1 refills | Status: DC
  Filled 2021-06-23 (×2): qty 60, 60d supply, fill #0

## 2021-06-24 ENCOUNTER — Other Ambulatory Visit: Payer: Self-pay

## 2021-07-08 ENCOUNTER — Other Ambulatory Visit: Payer: Self-pay

## 2021-07-10 ENCOUNTER — Other Ambulatory Visit: Payer: Self-pay

## 2021-07-15 ENCOUNTER — Other Ambulatory Visit: Payer: Self-pay | Admitting: Pharmacist

## 2021-08-25 ENCOUNTER — Ambulatory Visit (INDEPENDENT_AMBULATORY_CARE_PROVIDER_SITE_OTHER): Payer: No Typology Code available for payment source | Admitting: Family Medicine

## 2021-08-25 ENCOUNTER — Other Ambulatory Visit: Payer: Self-pay

## 2021-08-25 ENCOUNTER — Encounter: Payer: Self-pay | Admitting: Family Medicine

## 2021-08-25 VITALS — BP 122/82 | HR 88 | Temp 98.3°F | Ht 60.0 in | Wt 120.5 lb

## 2021-08-25 DIAGNOSIS — R1013 Epigastric pain: Secondary | ICD-10-CM | POA: Diagnosis not present

## 2021-08-25 DIAGNOSIS — Z7689 Persons encountering health services in other specified circumstances: Secondary | ICD-10-CM | POA: Diagnosis not present

## 2021-08-25 NOTE — Patient Instructions (Signed)
Keep a record of when your pain occurs and any triggers or other symptoms that you have so the GI can have more information.  ? ?Let us know if you do not hear from [facility] in the next few days.  ?

## 2021-08-25 NOTE — Progress Notes (Signed)
? ?  Subjective:  ? ? Patient ID: Cheryl Perkins, female    DOB: 04-12-1996, 26 y.o.   MRN: [id number] ? ?HPI ?Chief Complaint  ?Patient presents with  ? Establish Care  ?  Would like to discuss referral to GI  ? ?She is new to the practice and here to establish care.  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 ? ?Other providers: ?[facility]  ? ?Complains of intermittent epigastric pain that occurs randomly since giving birth 8 months ago.  Pain is random with no known triggers. She has pain without eating at times.  She also has associated nausea at times.  No fever, chills, chest pain, palpitations, cough, shortness of breath, vomiting or diarrhea. No melena.  ?History of constipation and painful bowel movements after giving birth but this has resolved.  Reports normal-appearing bowel movements daily. ? ? ?States her previous PCP did an abdominal US and it was negative per patient.  ? ?She is breastfeeding and not in favor of taking medications including antacids at this time.  ?She is receiving Depo-Provera every 3 months.  ? ?She has an 29 month old.  ?Married  ?Works at [employer] at [employer].  ? ?Reviewed allergies, medications, past medical, surgical, family, and social history. ? ? ? ? ?Review of Systems ?Pertinent positives and negatives in the history of present illness. ? ?   ?Objective:  ? Physical Exam ?BP 122/82 (BP Location: Right Arm, Patient Position: Sitting, Cuff Size: Normal)   Pulse 88   Temp 98.3 ?F (36.8 ?C) (Oral)   Ht 5' (1.524 m)   Wt 120 lb 8 oz (54.7 kg)   SpO2 98%   BMI 23.53 kg/m?  ? ?Alert and in no distress. Cardiac exam shows a regular sinus rhythm without murmurs or gallops. Lungs are clear to auscultation.  Abdomen is soft, nondistended, normal bowel sounds, mild tenderness to palpation to left upper quadrant without rebound or guarding.  No palpable masses or hepatosplenomegaly.  Extremities without edema.  Skin is warm and dry.  Normal mood. ? ? ? ?    ?Assessment & Plan:  ?Intermittent epigastric abdominal pain - Plan: Ambulatory referral to Gastroenterology ? ?Encounter to establish care ? ?Lactating mother ? ?She is a pleasant 26 year old female here today to establish care. ?I will request records from [facility].  She reportedly had a negative abdominal ultrasound for her abdominal pain.  She was also referred to GI and did not hear back.  Discussed keeping a record of her pain and triggers as well as any associated symptoms.  She declines trying over-the-counter medication for symptoms.  Referral made to [facility] gastroenterology per patient request.  She will follow-up here as needed. ?

## 2021-08-27 ENCOUNTER — Ambulatory Visit: Payer: No Typology Code available for payment source | Admitting: Family Medicine

## 2021-09-24 ENCOUNTER — Encounter: Payer: Self-pay | Admitting: Gastroenterology

## 2021-09-24 ENCOUNTER — Ambulatory Visit (INDEPENDENT_AMBULATORY_CARE_PROVIDER_SITE_OTHER): Payer: No Typology Code available for payment source | Admitting: Gastroenterology

## 2021-09-24 ENCOUNTER — Other Ambulatory Visit (INDEPENDENT_AMBULATORY_CARE_PROVIDER_SITE_OTHER): Payer: No Typology Code available for payment source

## 2021-09-24 ENCOUNTER — Other Ambulatory Visit: Payer: Self-pay

## 2021-09-24 VITALS — BP 120/66 | HR 80 | Ht 60.0 in | Wt 121.4 lb

## 2021-09-24 DIAGNOSIS — R1013 Epigastric pain: Secondary | ICD-10-CM

## 2021-09-24 DIAGNOSIS — R748 Abnormal levels of other serum enzymes: Secondary | ICD-10-CM

## 2021-09-24 LAB — CBC WITH DIFFERENTIAL/PLATELET
Basophils Absolute: 0 10*3/uL (ref 0.0–0.1)
Basophils Relative: 0.8 % (ref 0.0–3.0)
Eosinophils Absolute: 0.3 10*3/uL (ref 0.0–0.7)
Eosinophils Relative: 4.9 % (ref 0.0–5.0)
HCT: 41.8 % (ref 36.0–46.0)
Hemoglobin: 13.9 g/dL (ref 12.0–15.0)
Lymphocytes Relative: 38.1 % (ref 12.0–46.0)
Lymphs Abs: 2.2 10*3/uL (ref 0.7–4.0)
MCHC: 33.3 g/dL (ref 30.0–36.0)
MCV: 89.6 fl (ref 78.0–100.0)
Monocytes Absolute: 0.6 10*3/uL (ref 0.1–1.0)
Monocytes Relative: 9.8 % (ref 3.0–12.0)
Neutro Abs: 2.7 10*3/uL (ref 1.4–7.7)
Neutrophils Relative %: 46.4 % (ref 43.0–77.0)
Platelets: 247 10*3/uL (ref 150.0–400.0)
RBC: 4.67 Mil/uL (ref 3.87–5.11)
RDW: 12.9 % (ref 11.5–15.5)
WBC: 5.7 10*3/uL (ref 4.0–10.5)

## 2021-09-24 LAB — HEPATIC FUNCTION PANEL
ALT: 12 U/L (ref 0–35)
AST: 14 U/L (ref 0–37)
Albumin: 4.4 g/dL (ref 3.5–5.2)
Alkaline Phosphatase: 68 U/L (ref 39–117)
Bilirubin, Direct: 0.2 mg/dL (ref 0.0–0.3)
Total Bilirubin: 0.7 mg/dL (ref 0.2–1.2)
Total Protein: 7.3 g/dL (ref 6.0–8.3)

## 2021-09-24 LAB — LIPASE: Lipase: 105 U/L — ABNORMAL HIGH (ref 11.0–59.0)

## 2021-09-24 NOTE — Patient Instructions (Signed)
If you are age 26 or older, your body mass index should be between 23-30. Your Body mass index is 23.71 kg/m?Marland Kitchen If this is out of the aforementioned range listed, please consider follow up with your Primary Care Provider. ? ?If you are age 31 or younger, your body mass index should be between 19-25. Your Body mass index is 23.71 kg/m?Marland Kitchen If this is out of the aformentioned range listed, please consider follow up with your Primary Care Provider. ? ?Your provider has requested that you go to the basement level for lab work before leaving today. Press "B" on the elevator. The lab is located at the first door on the left as you exit the elevator.  ?  ?The [facility] providers would like to encourage you to use [facility] to communicate with providers for non-urgent requests or questions.  Due to long hold times on the telephone, sending your provider a message by [facility] may be a faster and more efficient way to get a response.  Please allow 48 business hours for a response.  Please remember that this is for non-urgent requests.  ? ?Due to recent changes in healthcare laws, you may see the results of your imaging and laboratory studies on MyChart before your provider has had a chance to review them.  We understand that in some cases there may be results that are confusing or concerning to you. Not all laboratory results come back in the same time frame and the provider may be waiting for multiple results in order to interpret others.  Please give [place] 48 hours in order for your provider to thoroughly review all the results before contacting the office for clarification of your results.  ? ? ?It was a pleasure to see you today! ? ?Thank you for trusting me with your gastrointestinal care!   ? ?Scott E.Tomasa Rand, MD  ?  ?

## 2021-09-24 NOTE — Progress Notes (Signed)
Serene,  ?Your lipase was elevated today.  Lipase is the enzyme produced by the pancreas, and when there are elevated levels of lipase in the blood, there can be concern for pancreatitis (inflammation of the pancreas), especially in the setting of epigastric pain. ?I recommend we obtain a CT of the abdomen to further look at the pancreas to make sure there is no evidence of inflammation. ? ? ? ?Bonita Quin, ?Can you please order a CT of the abdomen with p.o./IV contrast to evaluate for evidence of pancreatitis?

## 2021-09-24 NOTE — Progress Notes (Signed)
? ?HPI : Cheryl Perkins is a very pleasant 26 year old female with a history of anxiety who is referred to [place] by Berlinda Last, NP-C for further evaluation of chronic epigastric pain.  Patient asserts that she first noted this epigastric pain 1 or 2 months after delivery of her daughter last summer.  This pain has improved somewhat over the past several months and is now fairly infrequent, occurring maybe once a month.  The last time she had an episode was about 2 weeks ago.  Pain typically last 30 minutes to an hour or so.  It is always in the epigastrium and does not radiate to the back or shoulder.  It is sometimes associated with nausea but no vomiting.  Not associated with urge to defecate.  The pain is not related to meals.  It can happen on empty stomach as well as postprandially.  It does seem to happen more often in the afternoon rather than the morning or late evening.  The pain is described as a burning sensation with sharp/shooting characteristics as well. ?She has regular bowel movements, with formed brown stools daily.  No problems with constipation or diarrhea.  No hematochezia or melena.  Her weight has been stable.  She denies other upper GI symptoms such as heartburn, acid regurgitation or dysphagia. ?She denies NSAID use. ? ?She reportedly had a right upper quadrant ultrasound was normal around January of this year.  (Records not available). ? ? ?Past Medical History:  ?Diagnosis Date  ? Allergy   ? Gestational hypertension w/o significant proteinuria in 3rd trimester 12/04/2020  ? Moderate anxiety 04/06/2019  ? Physical exam, annual 03/07/2019  ? ? ? ?Past Surgical History:  ?Procedure Laterality Date  ? KNEE ARTHROSCOPY WITH ANTERIOR CRUCIATE LIGAMENT (ACL) REPAIR  2013  ? had first 2010  ? ?Family History  ?Problem Relation Age of Onset  ? Sarcoidosis Mother   ? Hypertension Father   ? Healthy Brother   ? Diabetes Maternal Grandfather   ? High blood pressure Maternal Grandfather   ? Breast  cancer Maternal Great-grandmother   ? ?Social History  ? ?Tobacco Use  ? Smoking status: Never  ? Smokeless tobacco: Never  ?Vaping Use  ? Vaping Use: Never used  ?Substance Use Topics  ? Alcohol use: Never  ? Drug use: Never  ? ?Current Outpatient Medications  ?Medication Sig Dispense Refill  ? medroxyPROGESTERone (DEPO-PROVERA) 150 MG/ML injection Inject 150 mg into the muscle every 3 (three) months.    ? Prenatal Vit-Fe Fumarate-FA (PRENATAL MULTIVITAMIN) TABS tablet Take 1 tablet by mouth daily at 12 noon.    ? sertraline (ZOLOFT) 50 MG tablet Take 1 tablet (50 mg total) by mouth daily. 90 tablet 1  ? sertraline (ZOLOFT) 50 MG tablet Take 1 tablet (50 mg total) by mouth daily. 60 tablet 1  ? ?No current facility-administered medications for this visit.  ? ?Allergies  ?Allergen Reactions  ? Inderal [Propranolol] Hives  ? ? ? ?Review of Systems: ?All systems reviewed and negative except where noted in HPI.  ? ? ?No results found. ? ?Physical Exam: ?Wt 121 lb 6.4 oz (55.1 kg)   BMI 23.71 kg/m?  ?Constitutional: Pleasant,well-developed, African-American female in no acute distress. ?HEENT: Normocephalic and atraumatic. Conjunctivae are normal. No scleral icterus. ?Neck supple.  ?Cardiovascular: Normal rate, regular rhythm.  ?Pulmonary/chest: Effort normal and breath sounds normal. No wheezing, rales or rhonchi. ?Abdominal: Soft, nondistended, nontender. Bowel sounds active throughout. There are no masses palpable. No hepatomegaly. ?Extremities: no  edema ?Neurological: Alert and oriented to person place and time. ?Skin: Skin is warm and dry. No rashes noted. ?Psychiatric: Normal mood and affect. Behavior is normal. ? ?CBC ?   ?Component Value Date/Time  ? WBC 11.6 (H) 12/05/2020 0530  ? RBC 3.94 12/05/2020 0530  ? HGB 10.9 (L) 12/05/2020 0530  ? HCT 33.7 (L) 12/05/2020 0530  ? PLT 194 12/05/2020 0530  ? MCV 85.5 12/05/2020 0530  ? MCH 27.7 12/05/2020 0530  ? MCHC 32.3 12/05/2020 0530  ? RDW 14.8 12/05/2020 0530   ? ? ?CMP  ?   ?Component Value Date/Time  ? NA 136 12/04/2020 1032  ? K 4.3 12/04/2020 1032  ? CL 109 12/04/2020 1032  ? CO2 20 (L) 12/04/2020 1032  ? GLUCOSE 70 12/04/2020 1032  ? BUN 7 12/04/2020 1032  ? CREATININE 0.81 12/04/2020 1032  ? CALCIUM 8.6 (L) 12/04/2020 1032  ? PROT 6.4 (L) 12/04/2020 1032  ? ALBUMIN 3.0 (L) 12/04/2020 1032  ? AST 31 12/04/2020 1032  ? ALT 16 12/04/2020 1032  ? ALKPHOS 133 (H) 12/04/2020 1032  ? BILITOT 0.7 12/04/2020 1032  ? GFRNONAA >60 12/04/2020 1032  ? ? ? ?ASSESSMENT AND PLAN: ?26 year old female with several months of episodic epigastric pain, without any reliable triggers, not related to meals, without any change in bowel habits.  No other concerning associated symptoms such as vomiting, diarrhea or weight loss.  No chronic GERD symptoms.  No NSAID use.  Normal right upper quadrant ultrasound per patient.  Differential includes gallbladder dysfunction/dyskinesia, H. pylori infection, peptic ulcer disease, nonulcer dyspepsia.  Patient does have a history of anxiety and reports being under significant stress with the recent new addition to her family.  She is not interested in empiric acid suppressive therapy or undergoing invasive diagnostic procedures unless absolutely necessary.  I recommended ruling out H. pylori and celiac disease, repeat CBC and CMP and lipase.  I suggest taking Pepcid as needed when she has her symptoms.  If symptoms persistent and no etiology is found, consider HIDA scan to further rule out gallbladder dyskinesia as well as EGD to rule out peptic ulcer disease/gastritis.  Patient was in agreement with this plan. ? ?Epigastric pain ?- H. pylori stool antigen ?- TTG, IgA ?- CBC, CMP, lipase ?- Pepcid as needed ?- Consider HIDA scan and/or EGD if symptoms persist ? ?Jaquarius Seder E. Candis Schatz, MD ?[facility] ? ?CC:  Henson, Vickie L, NP-C ? ?

## 2021-09-25 LAB — TISSUE TRANSGLUTAMINASE, IGA: (tTG) Ab, IgA: <1 U/mL

## 2021-09-25 LAB — IGA: Immunoglobulin A: 123 mg/dL (ref 47–310)

## 2021-09-28 NOTE — Progress Notes (Signed)
Anniemae, the test for celiac disease was negative.  Please complete the CT scan to evaluate your pancreas.

## 2021-09-29 ENCOUNTER — Encounter: Payer: Self-pay | Admitting: Gastroenterology

## 2021-09-29 ENCOUNTER — Encounter: Payer: Self-pay | Admitting: Family Medicine

## 2021-09-29 NOTE — Telephone Encounter (Signed)
This will be her 1st fill with you.. ok to refill?  

## 2021-09-30 ENCOUNTER — Other Ambulatory Visit: Payer: Self-pay

## 2021-09-30 MED ORDER — SERTRALINE HCL 50 MG PO TABS
50.0000 mg | ORAL_TABLET | Freq: Every day | ORAL | 2 refills | Status: DC
  Filled 2021-09-30 – 2021-10-07 (×2): qty 60, 60d supply, fill #0

## 2021-09-30 MED ORDER — MEDROXYPROGESTERONE ACETATE 150 MG/ML IM SUSY
150.0000 mg | PREFILLED_SYRINGE | INTRAMUSCULAR | 0 refills | Status: DC
  Filled 2021-09-30: qty 1, 90d supply, fill #0

## 2021-09-30 NOTE — Telephone Encounter (Signed)
Rx sent 

## 2021-10-01 ENCOUNTER — Other Ambulatory Visit: Payer: Self-pay

## 2021-10-07 ENCOUNTER — Other Ambulatory Visit: Payer: Self-pay

## 2021-10-08 ENCOUNTER — Ambulatory Visit (HOSPITAL_COMMUNITY): Payer: No Typology Code available for payment source

## 2021-10-08 ENCOUNTER — Other Ambulatory Visit: Payer: Self-pay

## 2021-10-14 ENCOUNTER — Other Ambulatory Visit: Payer: Self-pay

## 2021-11-23 ENCOUNTER — Encounter: Payer: Self-pay | Admitting: Family Medicine

## 2021-11-23 NOTE — Telephone Encounter (Signed)
Pt would like advise on how she can increase her libido.

## 2021-12-01 IMAGING — US US MFM OB FOLLOW-UP
1 series · 13 of 28 positions shown · non-contrast
Comparison: none

[Series 1: us mfm ob follow-up · 13 of 62 slices shown]
[im 3/62]
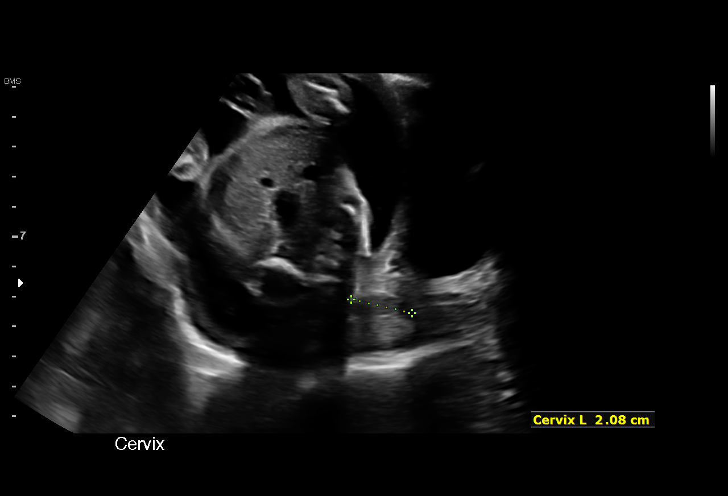
[im 7/62]
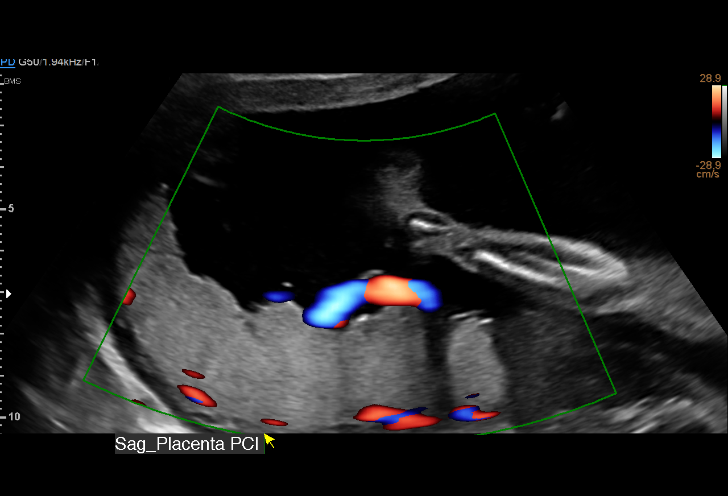
[im 12/62]
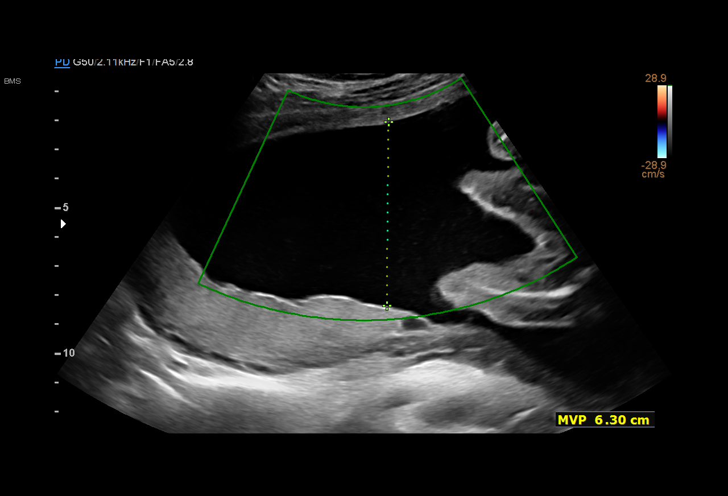
[im 16/62]
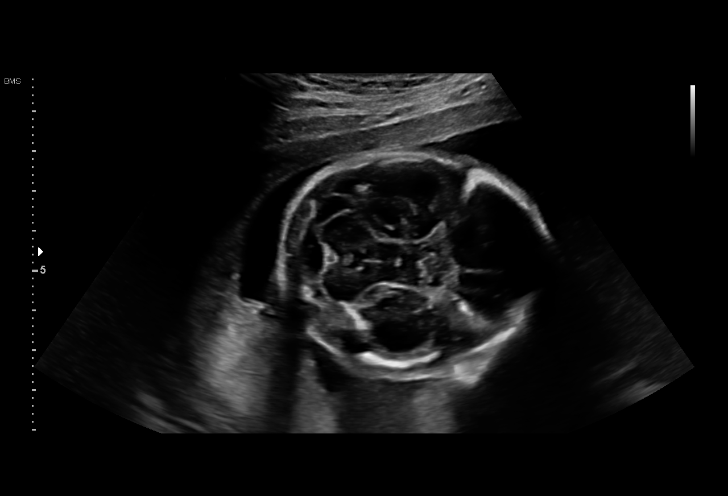
[im 21/62]
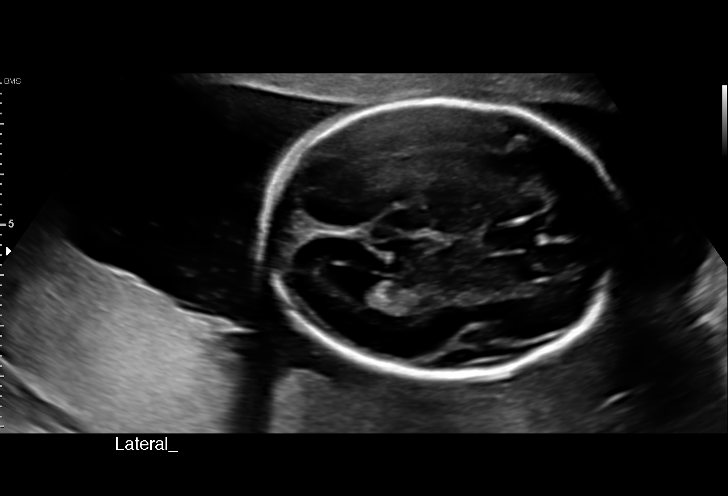
[im 25/62]
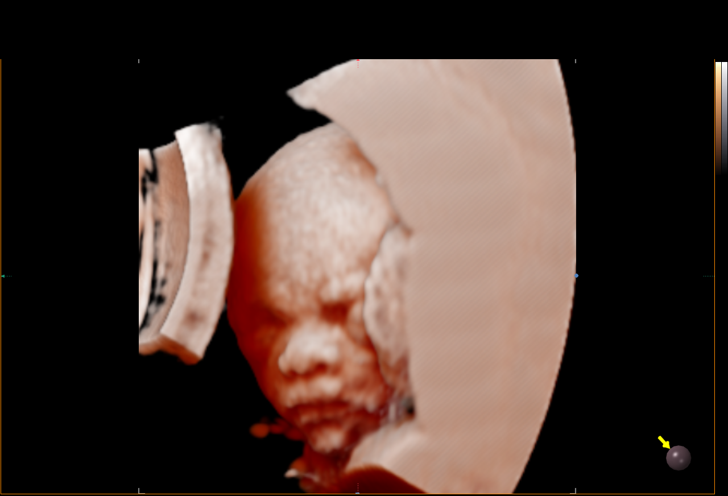
[im 32/62]
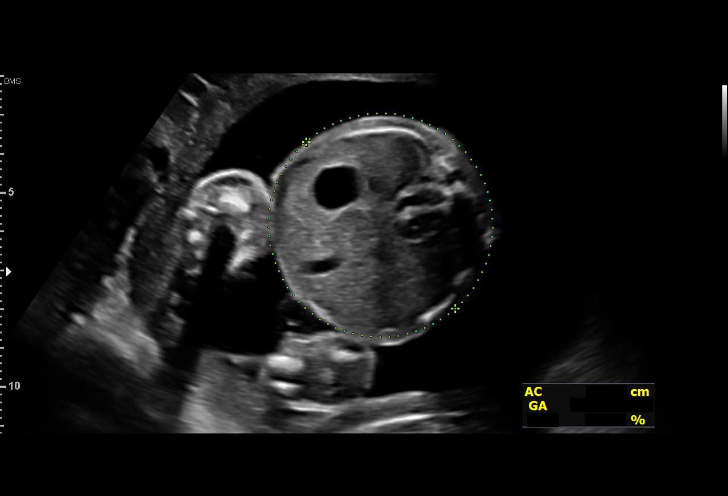
[im 37/62]
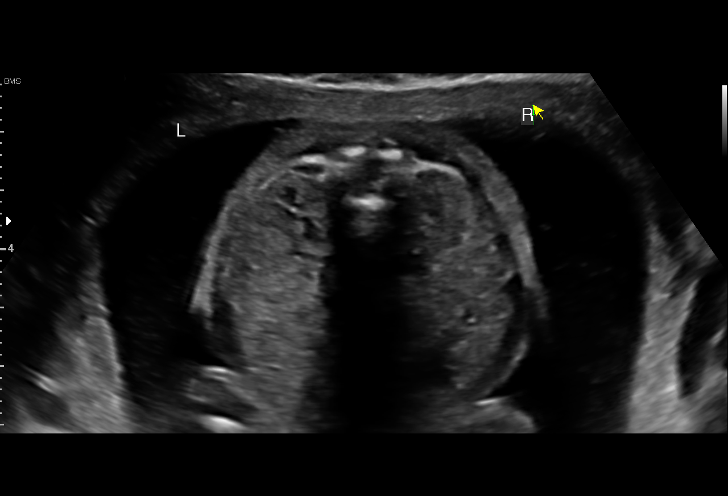
[im 41/62]
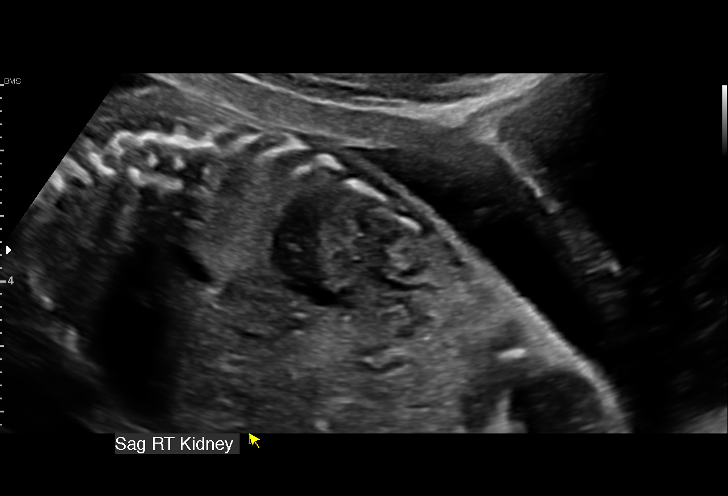
[im 46/62]
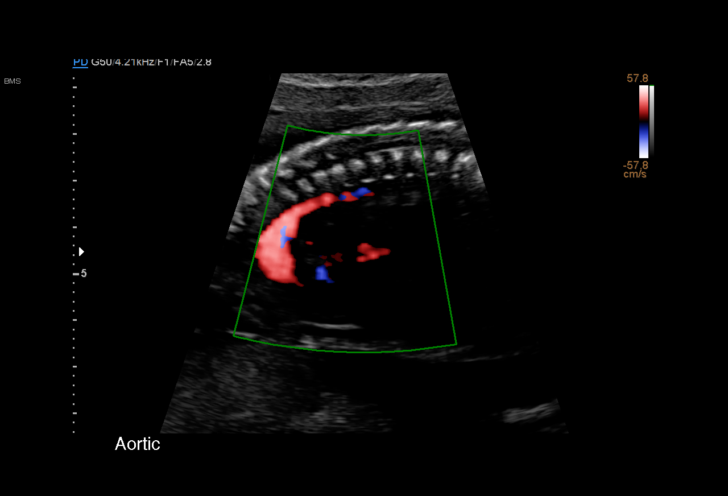
[im 50/62]
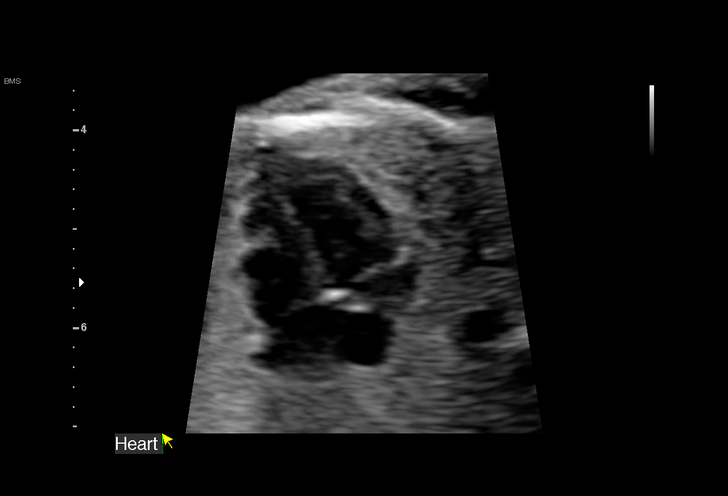
[im 55/62]
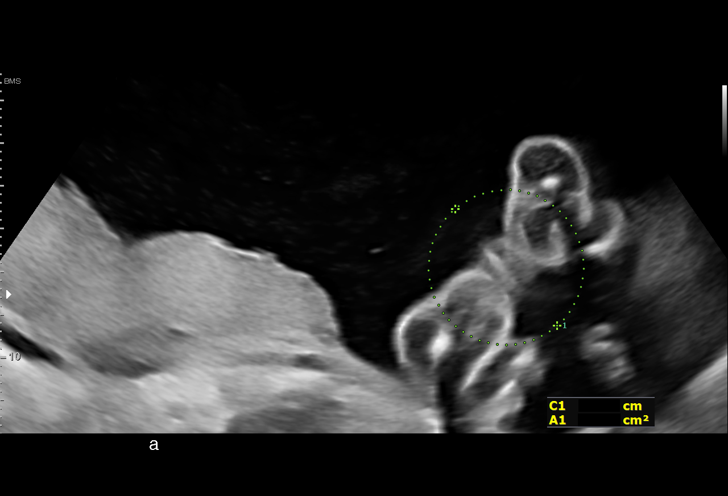
[im 59/62]
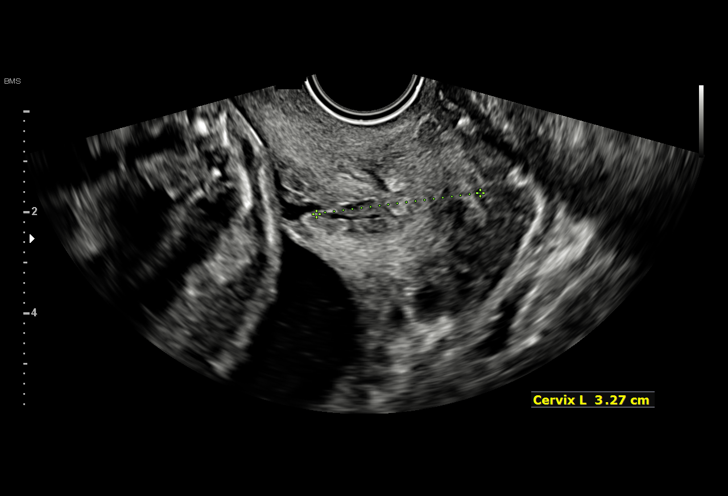

[13 of 28 positions shown; findings below may reference images not displayed]

OBGYN
                   THEIJSSEN

                                                      ARISSA
 2  US MFM OB TRANSVAGINAL                76817.2     SKUNCA
                                                      ARISSA

Indications

 Encounter for antenatal screening for
 malformations
 22 weeks gestation of pregnancy
Fetal Evaluation

 Num Of Fetuses:         1
 Fetal Heart Rate(bpm):  153
 Cardiac Activity:       Observed
 Presentation:           Breech
 Placenta:               Posterior Fundal
 P. Cord Insertion:      Visualized, central

 Amniotic Fluid
 AFI FV:      Within normal limits

                             Largest Pocket(cm)

Biometry
 BPD:      55.2  mm     G. Age:  22w 6d         44  %    CI:        73.48   %    70 - 86
                                                         FL/HC:      18.4   %    19.2 -
 HC:      204.6  mm     G. Age:  22w 4d         25  %    HC/AC:      1.15        1.05 -
 AC:      177.7  mm     G. Age:  22w 5d         35  %    FL/BPD:     68.1   %    71 - 87
 FL:       37.6  mm     G. Age:  22w 0d         15  %    FL/AC:      21.2   %    20 - 24
 CER:      23.9  mm     G. Age:  22w 0d         51  %

 LV:          7  mm
 CM:        7.4  mm

 Est. FW:     501  gm      1 lb 2 oz     23  %
OB History

 Gravidity:    1
Gestational Age

 LMP:           17w 1d        Date:  04/07/20                 EDD:   01/12/21
 U/S Today:     22w 4d                                        EDD:   12/05/20
 Best:          22w 6d     Det. By:  U/S  (07/11/20)          EDD:   12/03/20
Anatomy

 Cranium:               Appears normal         LVOT:                   Previously seen
 Cavum:                 Appears normal         Aortic Arch:            Appears normal
 Ventricles:            Appears normal         Ductal Arch:            Previously seen
 Choroid Plexus:        Previously seen        Diaphragm:              Appears normal
 Cerebellum:            Appears normal         Stomach:                Appears normal, left
                                                                       sided
 Posterior Fossa:       Appears normal         Abdomen:                Appears normal
 Nuchal Fold:           Not applicable (>20    Abdominal Wall:         Previously seen
                        wks GA)
 Face:                  Appears normal         Cord Vessels:           Previously seen
                        (orbits and profile)
 Lips:                  Previously seen        Kidneys:                Appear normal
 Palate:                Not well visualized    Bladder:                Appears normal
 Thoracic:              Appears normal         Spine:                  Previously seen
 Heart:                 Previously seen        Upper Extremities:      Previously seen
 RVOT:                  Previously seen        Lower Extremities:      Previously seen

 Other:  Fetus appears to be female. Nasal bone prev visualized. Heels/feet
         and open hands/5th digits prev visualized. VC, 3VV and 3VTV prev
         visualized.
Cervix Uterus Adnexa

 Cervix
 Length:            3.3  cm.
 Normal appearance by transvaginal scan

 Uterus
 No abnormality visualized.

 Right Ovary
 Not visualized.
 Left Ovary
 Not visualized.

 Cul De Sac
 No free fluid seen.

 Adnexa
 No adnexal mass visualized.
Impression

 Patient return for interval fetal growth assessment.  Her EDD
 was established by previous ultrasound performed at our
 office.

 Amniotic fluid is normal and good fetal activity is seen .Fetal
 biometry is consistent with her previously-established dates .
 Good interval growth is seen.  Because of the appearance of
 short cervix on transabdominal scan, we performed
 transvaginal ultrasound to evaluate the cervix.  The cervix
 measures 3.3 cm, which is within normal limits.  No
 shortening was seen on transfundal pressure.

 We reassured the patient of the findings
Recommendations

 Follow-up scans as clinically indicated.
                 Gerster, Fabiienne

## 2021-12-31 ENCOUNTER — Encounter: Payer: Self-pay | Admitting: Gastroenterology

## 2021-12-31 ENCOUNTER — Ambulatory Visit (HOSPITAL_COMMUNITY)
Admission: RE | Admit: 2021-12-31 | Discharge: 2021-12-31 | Disposition: A | Discharge status: Home / Self Care | Payer: No Typology Code available for payment source | Source: Ambulatory Visit | Attending: Gastroenterology | Admitting: Gastroenterology

## 2021-12-31 DIAGNOSIS — R1013 Epigastric pain: Secondary | ICD-10-CM | POA: Insufficient documentation

## 2021-12-31 DIAGNOSIS — R748 Abnormal levels of other serum enzymes: Secondary | ICD-10-CM | POA: Insufficient documentation

## 2021-12-31 MED ORDER — IOHEXOL 300 MG/ML  SOLN
100.0000 mL | Freq: Once | INTRAMUSCULAR | Status: AC | PRN
  Administered 2021-12-31: 100 mL via INTRAVENOUS

## 2021-12-31 MED ORDER — SODIUM CHLORIDE (PF) 0.9 % IJ SOLN
INTRAMUSCULAR | Status: AC
  Filled 2021-12-31: qty 50

## 2022-01-03 ENCOUNTER — Other Ambulatory Visit: Payer: Self-pay | Admitting: Family Medicine

## 2022-01-04 ENCOUNTER — Other Ambulatory Visit: Payer: Self-pay

## 2022-01-04 MED ORDER — MEDROXYPROGESTERONE ACETATE 150 MG/ML IM SUSY
150.0000 mg | PREFILLED_SYRINGE | INTRAMUSCULAR | 0 refills | Status: DC
  Filled 2022-01-04: qty 1, 90d supply, fill #0

## 2022-01-06 ENCOUNTER — Other Ambulatory Visit: Payer: Self-pay

## 2022-01-07 NOTE — Progress Notes (Signed)
Amiley,  As you saw, the CT was unremarkable.  If your symptoms are the still the same as they were when I saw you in clinic, I would next recommend evaluating for gallbladder dysfunction with a HIDA scan.  This test assess the flow of bile in and out of your gallbladder.   If your symptoms have changed, I would recommend you make a follow up visit to discuss them further. Please let us know what you would like to do.

## 2022-01-22 ENCOUNTER — Encounter: Payer: Self-pay | Admitting: Family Medicine

## 2022-01-22 ENCOUNTER — Ambulatory Visit (INDEPENDENT_AMBULATORY_CARE_PROVIDER_SITE_OTHER): Payer: No Typology Code available for payment source | Admitting: Family Medicine

## 2022-01-22 VITALS — BP 116/66 | HR 103 | Temp 98.0°F | Ht 60.0 in | Wt 126.0 lb

## 2022-01-22 DIAGNOSIS — R519 Headache, unspecified: Secondary | ICD-10-CM | POA: Insufficient documentation

## 2022-01-22 DIAGNOSIS — R61 Generalized hyperhidrosis: Secondary | ICD-10-CM | POA: Diagnosis not present

## 2022-01-22 LAB — TSH: TSH: 0.51 u[IU]/mL (ref 0.35–5.50)

## 2022-01-22 LAB — CBC WITH DIFFERENTIAL/PLATELET
Basophils Absolute: 0 10*3/uL (ref 0.0–0.1)
Basophils Relative: 0.3 % (ref 0.0–3.0)
Eosinophils Absolute: 0.1 10*3/uL (ref 0.0–0.7)
Eosinophils Relative: 2.2 % (ref 0.0–5.0)
HCT: 41.3 % (ref 36.0–46.0)
Hemoglobin: 13.7 g/dL (ref 12.0–15.0)
Lymphocytes Relative: 41 % (ref 12.0–46.0)
Lymphs Abs: 2.2 10*3/uL (ref 0.7–4.0)
MCHC: 33.3 g/dL (ref 30.0–36.0)
MCV: 88.9 fl (ref 78.0–100.0)
Monocytes Absolute: 0.6 10*3/uL (ref 0.1–1.0)
Monocytes Relative: 11.1 % (ref 3.0–12.0)
Neutro Abs: 2.4 10*3/uL (ref 1.4–7.7)
Neutrophils Relative %: 45.4 % (ref 43.0–77.0)
Platelets: 246 10*3/uL (ref 150.0–400.0)
RBC: 4.64 Mil/uL (ref 3.87–5.11)
RDW: 13.1 % (ref 11.5–15.5)
WBC: 5.3 10*3/uL (ref 4.0–10.5)

## 2022-01-22 LAB — COMPREHENSIVE METABOLIC PANEL
ALT: 9 U/L (ref 0–35)
AST: 16 U/L (ref 0–37)
Albumin: 4.2 g/dL (ref 3.5–5.2)
Alkaline Phosphatase: 51 U/L (ref 39–117)
BUN: 8 mg/dL (ref 6–23)
CO2: 27 mEq/L (ref 19–32)
Calcium: 8.9 mg/dL (ref 8.4–10.5)
Chloride: 108 mEq/L (ref 96–112)
Creatinine, Ser: 0.87 mg/dL (ref 0.40–1.20)
GFR: 92.43 mL/min (ref >60.00)
Glucose, Bld: 86 mg/dL (ref 70–99)
Potassium: 3.6 mEq/L (ref 3.5–5.1)
Sodium: 142 mEq/L (ref 135–145)
Total Bilirubin: 0.7 mg/dL (ref 0.2–1.2)
Total Protein: 7 g/dL (ref 6.0–8.3)

## 2022-01-22 LAB — T4, FREE: Free T4: 0.87 ng/dL (ref 0.60–1.60)

## 2022-01-22 LAB — T3, FREE: T3, Free: 3.8 pg/mL (ref 2.3–4.2)

## 2022-01-22 NOTE — Assessment & Plan Note (Signed)
Check labs including thyroid panel, CBC, CMP and follow-up.  Recommend consulting with her OB/GYN if these labs are normal since she is on Depo-Provera injections and the night sweats have worsened since having her baby.

## 2022-01-22 NOTE — Patient Instructions (Signed)
Go to the first floor for labs before leaving today.   Start taking a multi- vitamin and a magnesium supplement over the counter.   We will be in touch with your lab results.

## 2022-01-22 NOTE — Progress Notes (Signed)
Subjective:     Patient ID: Cheryl Perkins, female    DOB: [birth year], 26 y.o.   MRN: [id number]  Chief Complaint  Patient presents with   Night Sweats    Since she had her baby a year ago she believes her hormones may be off, has been having night sweats for a long time   Headache    Headache    Patient is in today for night sweats x several months.  Hx of intermittent night sweats that are becoming more frequent.   She also reports approximately 3 frontal headaches per week. Occasionally she takes ibuprofen.   Feels hydrated and eats regular meals. Sleep is not great.   She also reports having a decreased libido.   Denies fever, chills, dizziness, URI symptoms, chest pain, palpitations, shortness of breath, N/V/D, urinary symptoms, LE edema.    Stopped breast feeding in May.  On Depo and not having her menstrual cycles.   Stopped Zoloft cold [place] in April and her mood is fine.   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      Health Maintenance Due  Topic Date Due   HPV VACCINES (1 - 2-dose series) Never done   Hepatitis C Screening  Never done   TETANUS/TDAP  Never done   PAP-Cervical Cytology Screening  01/12/2021   PAP SMEAR-Modifier  09/05/2021   INFLUENZA VACCINE  01/05/2022    Past Medical History:  Diagnosis Date   Allergy    Gestational hypertension w/o significant proteinuria in 3rd trimester 12/04/2020   Moderate anxiety 04/06/2019   Physical exam, annual 03/07/2019    Past Surgical History:  Procedure Laterality Date   KNEE ARTHROSCOPY WITH ANTERIOR CRUCIATE LIGAMENT (ACL) REPAIR  2013   had first 2010    Family History  Problem Relation Age of Onset   Sarcoidosis Mother    Hypertension Father    Healthy Brother    Diabetes Maternal Grandfather    High blood pressure Maternal Grandfather    Breast cancer Maternal Great-grandmother    Colon polyps Neg Hx    Colon cancer Neg Hx     Social History   Socioeconomic History   Marital status:  Married    Spouse name: Not on file   Number of children: 0   Years of education: Not on file   Highest education level: Not on file  Occupational History   Occupation: patient access specialist    Employer: [employer]  Tobacco Use   Smoking status: Never   Smokeless tobacco: Never  Vaping Use   Vaping Use: Never used  Substance and Sexual Activity   Alcohol use: Never   Drug use: Never   Sexual activity: Yes  Other Topics Concern   Not on file  Social History Narrative   Not on file   Social Determinants of Health   Financial Resource Strain: Not on file  Food Insecurity: Not on file  Transportation Needs: Not on file  Physical Activity: Not on file  Stress: Not on file  Social Connections: Not on file  Intimate Partner Violence: Unknown (03/07/2019)   Humiliation, Afraid, Rape, and Kick questionnaire    Fear of Current or Ex-Partner: Patient refused    Emotionally Abused: Patient refused    Physically Abused: Patient refused    Sexually Abused: Patient refused    Outpatient Medications Prior to Visit  Medication Sig Dispense Refill   medroxyPROGESTERone Acetate 150 MG/ML SUSY Inject 1 mL (150 mg total) into the muscle  every 3 (three) months. 1 mL 0   Prenatal Vit-Fe Fumarate-FA (PRENATAL MULTIVITAMIN) TABS tablet Take 1 tablet by mouth daily at 12 noon.     sertraline (ZOLOFT) 50 MG tablet Take 1 tablet (50 mg total) by mouth daily. 60 tablet 2   No facility-administered medications prior to visit.    Allergies  Allergen Reactions   Inderal [Propranolol] Hives    Review of Systems  Neurological:  Positive for headaches.       Objective:    Physical Exam Constitutional:      General: She is not in acute distress.    Appearance: She is not ill-appearing.  HENT:     Mouth/Throat:     Mouth: Mucous membranes are moist.  Eyes:     General: No visual field deficit.    Extraocular Movements: Extraocular movements intact.     Pupils: Pupils are equal,  round, and reactive to light.  Cardiovascular:     Rate and Rhythm: Normal rate and regular rhythm.     Heart sounds: Normal heart sounds.  Pulmonary:     Effort: Pulmonary effort is normal.     Breath sounds: Normal breath sounds.  Musculoskeletal:        General: Normal range of motion.     Cervical back: Normal range of motion and neck supple.  Lymphadenopathy:     Cervical: No cervical adenopathy.  Skin:    General: Skin is warm and dry.  Neurological:     Mental Status: She is alert and oriented to person, place, and time.     Cranial Nerves: No cranial nerve deficit or facial asymmetry.     Sensory: No sensory deficit.     Motor: No weakness.  Psychiatric:        Mood and Affect: Mood normal.        Speech: Speech normal.        Behavior: Behavior normal.     BP 116/66 (BP Location: Left Arm, Patient Position: Sitting, Cuff Size: Large)   Pulse (!) 103   Temp 98 F (36.7 C) (Temporal)   Ht 5' (1.524 m)   Wt 126 lb (57.2 kg)   SpO2 98%   BMI 24.61 kg/m  Wt Readings from Last 3 Encounters:  01/22/22 126 lb (57.2 kg)  09/24/21 121 lb 6.4 oz (55.1 kg)  08/25/21 120 lb 8 oz (54.7 kg)       Assessment & Plan:   Problem List Items Addressed This Visit       Other   Intermittent headache    Appear to be tension related.  Recommend staying hydrated, eating a healthy diet, getting regular sleep and starting on a multivitamin including magnesium supplement.  Keep a headache journal and look for triggers.  Follow-up as needed.      Night sweats - Primary    Check labs including thyroid panel, CBC, CMP and follow-up.  Recommend consulting with her OB/GYN if these labs are normal since she is on Depo-Provera injections and the night sweats have worsened since having her baby.      Relevant Orders   CBC with Differential/Platelet   Comprehensive metabolic panel   TSH   T4, free   T3, free    I have discontinued Kurtis Bushman. Paccione's prenatal multivitamin and  sertraline. I am also having her maintain her medroxyPROGESTERone Acetate.  No orders of the defined types were placed in this encounter.

## 2022-01-22 NOTE — Assessment & Plan Note (Signed)
Appear to be tension related.  Recommend staying hydrated, eating a healthy diet, getting regular sleep and starting on a multivitamin including magnesium supplement.  Keep a headache journal and look for triggers.  Follow-up as needed.

## 2022-04-02 ENCOUNTER — Other Ambulatory Visit: Payer: Self-pay

## 2022-04-06 ENCOUNTER — Other Ambulatory Visit: Payer: Self-pay | Admitting: Family Medicine

## 2022-04-06 ENCOUNTER — Other Ambulatory Visit: Payer: Self-pay

## 2022-04-06 MED ORDER — MEDROXYPROGESTERONE ACETATE 150 MG/ML IM SUSY
150.0000 mg | PREFILLED_SYRINGE | INTRAMUSCULAR | 0 refills | Status: DC
  Filled 2022-04-06: qty 1, 90d supply, fill #0

## 2022-04-28 ENCOUNTER — Other Ambulatory Visit: Payer: Self-pay

## 2022-04-28 ENCOUNTER — Telehealth: Payer: No Typology Code available for payment source | Admitting: Physician Assistant

## 2022-04-28 DIAGNOSIS — R3989 Other symptoms and signs involving the genitourinary system: Secondary | ICD-10-CM | POA: Diagnosis not present

## 2022-04-28 MED ORDER — CEPHALEXIN 500 MG PO CAPS
500.0000 mg | ORAL_CAPSULE | Freq: Two times a day (BID) | ORAL | 0 refills | Status: DC
  Filled 2022-04-28: qty 14, 7d supply, fill #0

## 2022-04-28 NOTE — Progress Notes (Signed)

## 2022-06-16 ENCOUNTER — Encounter: Payer: Self-pay | Admitting: General Practice

## 2022-06-29 ENCOUNTER — Other Ambulatory Visit: Payer: Self-pay | Admitting: Family Medicine

## 2022-06-30 ENCOUNTER — Other Ambulatory Visit: Payer: Self-pay

## 2022-06-30 MED ORDER — MEDROXYPROGESTERONE ACETATE 150 MG/ML IM SUSY
150.0000 mg | PREFILLED_SYRINGE | INTRAMUSCULAR | 0 refills | Status: DC
  Filled 2022-06-30: qty 1, 90d supply, fill #0

## 2022-07-02 ENCOUNTER — Ambulatory Visit: Payer: 59

## 2022-07-08 ENCOUNTER — Other Ambulatory Visit (HOSPITAL_BASED_OUTPATIENT_CLINIC_OR_DEPARTMENT_OTHER): Payer: Self-pay

## 2022-07-08 DIAGNOSIS — N760 Acute vaginitis: Secondary | ICD-10-CM | POA: Diagnosis not present

## 2022-07-08 DIAGNOSIS — Z6826 Body mass index (BMI) 26.0-26.9, adult: Secondary | ICD-10-CM | POA: Diagnosis not present

## 2022-07-08 DIAGNOSIS — Z01419 Encounter for gynecological examination (general) (routine) without abnormal findings: Secondary | ICD-10-CM | POA: Diagnosis not present

## 2022-07-08 MED ORDER — METRONIDAZOLE 500 MG PO TABS
500.0000 mg | ORAL_TABLET | Freq: Two times a day (BID) | ORAL | 0 refills | Status: DC
  Filled 2022-07-08: qty 14, 7d supply, fill #0

## 2022-08-06 ENCOUNTER — Other Ambulatory Visit (HOSPITAL_BASED_OUTPATIENT_CLINIC_OR_DEPARTMENT_OTHER): Payer: Self-pay

## 2022-08-06 DIAGNOSIS — R6882 Decreased libido: Secondary | ICD-10-CM | POA: Diagnosis not present

## 2022-08-06 DIAGNOSIS — R37 Sexual dysfunction, unspecified: Secondary | ICD-10-CM | POA: Diagnosis not present

## 2022-08-06 DIAGNOSIS — Z309 Encounter for contraceptive management, unspecified: Secondary | ICD-10-CM | POA: Diagnosis not present

## 2022-08-06 MED ORDER — NORGESTIMATE-ETH ESTRADIOL 0.25-35 MG-MCG PO TABS
1.0000 | ORAL_TABLET | Freq: Every day | ORAL | 3 refills | Status: DC
  Filled 2022-08-06: qty 112, 84d supply, fill #0
  Filled 2023-01-21: qty 28, 21d supply, fill #1
  Filled 2023-02-07: qty 28, 21d supply, fill #2
  Filled 2023-02-27: qty 28, 21d supply, fill #3
  Filled 2023-03-25: qty 28, 21d supply, fill #4

## 2022-08-18 ENCOUNTER — Encounter: Payer: 59 | Admitting: Family Medicine

## 2022-08-19 ENCOUNTER — Other Ambulatory Visit (HOSPITAL_BASED_OUTPATIENT_CLINIC_OR_DEPARTMENT_OTHER): Payer: Self-pay

## 2022-10-29 DIAGNOSIS — R37 Sexual dysfunction, unspecified: Secondary | ICD-10-CM | POA: Diagnosis not present

## 2022-10-29 DIAGNOSIS — Z309 Encounter for contraceptive management, unspecified: Secondary | ICD-10-CM | POA: Diagnosis not present

## 2022-12-23 ENCOUNTER — Other Ambulatory Visit: Payer: Self-pay

## 2022-12-23 ENCOUNTER — Other Ambulatory Visit (HOSPITAL_BASED_OUTPATIENT_CLINIC_OR_DEPARTMENT_OTHER): Payer: Self-pay

## 2022-12-23 MED ORDER — NORGESTIMATE-ETH ESTRADIOL 0.25-35 MG-MCG PO TABS
1.0000 | ORAL_TABLET | Freq: Every day | ORAL | 3 refills | Status: DC
  Filled 2022-12-23: qty 84, 84d supply, fill #0
  Filled 2022-12-28: qty 112, 84d supply, fill #0
  Filled 2022-12-28: qty 112, 112d supply, fill #0
  Filled 2022-12-28: qty 28, 21d supply, fill #0

## 2022-12-24 ENCOUNTER — Other Ambulatory Visit (HOSPITAL_BASED_OUTPATIENT_CLINIC_OR_DEPARTMENT_OTHER): Payer: Self-pay

## 2022-12-28 ENCOUNTER — Other Ambulatory Visit (HOSPITAL_BASED_OUTPATIENT_CLINIC_OR_DEPARTMENT_OTHER): Payer: Self-pay

## 2023-01-21 ENCOUNTER — Other Ambulatory Visit (HOSPITAL_BASED_OUTPATIENT_CLINIC_OR_DEPARTMENT_OTHER): Payer: Self-pay

## 2023-02-08 ENCOUNTER — Other Ambulatory Visit: Payer: Self-pay

## 2023-03-03 DIAGNOSIS — E0590 Thyrotoxicosis, unspecified without thyrotoxic crisis or storm: Secondary | ICD-10-CM | POA: Diagnosis not present

## 2023-03-03 DIAGNOSIS — N951 Menopausal and female climacteric states: Secondary | ICD-10-CM | POA: Diagnosis not present

## 2023-03-09 DIAGNOSIS — N951 Menopausal and female climacteric states: Secondary | ICD-10-CM | POA: Diagnosis not present

## 2023-03-09 DIAGNOSIS — Z6824 Body mass index (BMI) 24.0-24.9, adult: Secondary | ICD-10-CM | POA: Diagnosis not present

## 2023-03-09 DIAGNOSIS — R454 Irritability and anger: Secondary | ICD-10-CM | POA: Diagnosis not present

## 2023-03-09 DIAGNOSIS — G479 Sleep disorder, unspecified: Secondary | ICD-10-CM | POA: Diagnosis not present

## 2023-06-24 ENCOUNTER — Encounter: Payer: Self-pay | Admitting: Family Medicine

## 2023-06-24 ENCOUNTER — Telehealth (INDEPENDENT_AMBULATORY_CARE_PROVIDER_SITE_OTHER): Payer: Self-pay | Admitting: Nurse Practitioner

## 2023-06-24 ENCOUNTER — Other Ambulatory Visit: Payer: Self-pay

## 2023-06-24 ENCOUNTER — Other Ambulatory Visit: Payer: Self-pay | Admitting: Nurse Practitioner

## 2023-06-24 DIAGNOSIS — Z3200 Encounter for pregnancy test, result unknown: Secondary | ICD-10-CM | POA: Insufficient documentation

## 2023-06-24 DIAGNOSIS — Z113 Encounter for screening for infections with a predominantly sexual mode of transmission: Secondary | ICD-10-CM

## 2023-06-24 DIAGNOSIS — N939 Abnormal uterine and vaginal bleeding, unspecified: Secondary | ICD-10-CM | POA: Insufficient documentation

## 2023-06-24 NOTE — Progress Notes (Signed)
   Established Patient Office Visit  An audio/visual tele-health visit was completed today for this patient. I connected with  Cheryl Perkins on 06/24/23 utilizing audio/visual technology and verified that I am speaking with the correct person using two identifiers. The patient was located at their car, and I was located at the office of [facility] at [facility] during the encounter. I discussed the limitations of evaluation and management by telemedicine. The patient expressed understanding and agreed to proceed.     Subjective   Patient ID: Cheryl Perkins, female    DOB: 10/14/1995  Age: 28 y.o. MRN: [id number]  Chief Complaint  Patient presents with   Menstrual Problem    Patient arrives for acute visit for the above.  She reports that her last normal menstrual period started on 05/08/2023.  She had some vaginal spotting on 06/13/2023, then her period stopped and restarted a few days later.  She reports that normally her menstrual cycles are very regular with no irregularities in regards to menstrual blood loss volume.  This has been very concerning for her.  She is mainly concerned she may be pregnant.  She took an at-home pregnancy test earlier this week which was negative x 2.  She is not having any abdominal pain and no spotting currently.  She denies any pain with urination or notable hematuria.    Review of Systems  Gastrointestinal:  Positive for abdominal pain (in december, now resolved). Negative for nausea and vomiting.  Genitourinary:  Negative for dysuria and hematuria.      Objective:     LMP 05/13/2023    Physical Exam Comprehensive physical exam not completed today as office visit was conducted remotely.  Patient appears well over video.  Patient was alert and oriented, and appeared to have appropriate judgment.   No results found for any visits on 06/24/23.    The ASCVD Risk score (Arnett DK, et al., 2019) failed to calculate for the following  reasons:   The 2019 ASCVD risk score is only valid for ages 24 to 10    Assessment & Plan:   Problem List Items Addressed This Visit       Genitourinary   Abnormal uterine bleeding   Acute Etiology unclear We will order hCG serum testing as well as test for UTI and STDs.  If all testing negative would recommend she just monitor herself for pain or see if next menstrual cycle appears different from what her normal menstrual cycle is like.  If this occurs she was encouraged to reach out to PCP for referral to OB/GYN. If hCG is positive, will urgently refer her to ER to rule out ectopic pregnancy.         Other   Encounter for pregnancy test, result unknown - Primary   Labs ordered, further recommendations may be made based upon these results       Routine screening for STI (sexually transmitted infection)   Labs ordered, further recommendations may be made based upon these results        Return if symptoms worsen or fail to improve.    Cheryl Paddy, NP

## 2023-06-24 NOTE — Assessment & Plan Note (Addendum)
Acute Etiology unclear We will order hCG serum testing as well as test for UTI and STDs.  If all testing negative would recommend she just monitor herself for pain or see if next menstrual cycle appears different from what her normal menstrual cycle is like.  If this occurs she was encouraged to reach out to PCP for referral to OB/GYN. If hCG is positive, will urgently refer her to ER to rule out ectopic pregnancy.

## 2023-06-24 NOTE — Addendum Note (Signed)
Addended by: Elenore Paddy on: 06/24/2023 03:33 PM   Modules accepted: Orders

## 2023-06-24 NOTE — Addendum Note (Signed)
Addended by: Jiles Prows E on: 06/24/2023 12:54 PM   Modules accepted: Orders

## 2023-06-24 NOTE — Assessment & Plan Note (Signed)
Labs ordered, further recommendations may be made based upon these results. 

## 2023-06-25 LAB — RPR: RPR Ser Ql: NONREACTIVE

## 2023-06-25 LAB — URINE CULTURE

## 2023-06-25 LAB — HIV ANTIBODY (ROUTINE TESTING W REFLEX): HIV 1&2 Ab, 4th Generation: NONREACTIVE

## 2023-06-27 ENCOUNTER — Encounter: Payer: Self-pay | Admitting: Nurse Practitioner

## 2023-06-27 LAB — HCG, SERUM, QUALITATIVE: Preg, Serum: NEGATIVE

## 2023-06-27 LAB — CHLAMYDIA/NEISSERIA GONORRHOEAE RNA,TMA,UROGENTIAL
C. trachomatis RNA, TMA: NOT DETECTED
N. gonorrhoeae RNA, TMA: NOT DETECTED

## 2023-06-30 ENCOUNTER — Ambulatory Visit: Payer: Self-pay | Admitting: Family Medicine

## 2023-09-13 DIAGNOSIS — R5383 Other fatigue: Secondary | ICD-10-CM | POA: Diagnosis not present

## 2023-09-13 DIAGNOSIS — J301 Allergic rhinitis due to pollen: Secondary | ICD-10-CM | POA: Diagnosis not present

## 2024-04-02 ENCOUNTER — Telehealth (HOSPITAL_BASED_OUTPATIENT_CLINIC_OR_DEPARTMENT_OTHER): Payer: Self-pay | Admitting: *Deleted

## 2024-04-02 NOTE — Telephone Encounter (Signed)
 Pt has appt scheduled.

## 2024-04-02 NOTE — Telephone Encounter (Signed)
 Copied from CRM [id number]. Topic: Appointments - Transfer of Care >> Apr 02, 2024  1:10 PM Shereese L wrote: Pt is requesting to transfer FROM: Dr. Lendia Pt is requesting to transfer TO: Dr. Dottie Reason for requested transfer: Relocated It is the responsibility of the team the patient would like to transfer to (Dr. Dottie) to reach out to the patient if for any reason this transfer is not acceptable.

## 2024-04-10 ENCOUNTER — Ambulatory Visit (INDEPENDENT_AMBULATORY_CARE_PROVIDER_SITE_OTHER): Payer: Self-pay | Admitting: Family Medicine

## 2024-04-10 ENCOUNTER — Encounter (HOSPITAL_BASED_OUTPATIENT_CLINIC_OR_DEPARTMENT_OTHER): Payer: Self-pay | Admitting: Family Medicine

## 2024-04-10 VITALS — BP 120/79 | HR 91 | Temp 98.6°F | Resp 16 | Ht 60.0 in | Wt 120.7 lb

## 2024-04-10 DIAGNOSIS — Z3A00 Weeks of gestation of pregnancy not specified: Secondary | ICD-10-CM

## 2024-04-10 DIAGNOSIS — J301 Allergic rhinitis due to pollen: Secondary | ICD-10-CM | POA: Diagnosis not present

## 2024-04-10 DIAGNOSIS — Z9109 Other allergy status, other than to drugs and biological substances: Secondary | ICD-10-CM | POA: Insufficient documentation

## 2024-04-10 DIAGNOSIS — O133 Gestational [pregnancy-induced] hypertension without significant proteinuria, third trimester: Secondary | ICD-10-CM

## 2024-04-10 NOTE — Progress Notes (Signed)
 New Patient Office Visit  Subjective    Patient ID: Cheryl Perkins, female    DOB: [birth year]  Age: 28 y.o. MRN: [id number]  CC:  Chief Complaint  Patient presents with   Establish Care    Establish Care     HPI Cheryl Perkins presents to establish care F/u as above.  New to my practice.  Overall doing well with few concerns.  Sees GYN on occasion.  Has a very healthy lifestyle and an adorable child at home.  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  No outpatient encounter medications on file as of 04/10/2024.   No facility-administered encounter medications on file as of 04/10/2024.    Past Medical History:  Diagnosis Date   Allergy    Contraception management    f/by GYN   Gestational hypertension w/o significant proteinuria in 3rd trimester 12/04/2020    Past Surgical History:  Procedure Laterality Date   KNEE ARTHROSCOPY WITH ANTERIOR CRUCIATE LIGAMENT (ACL) REPAIR  2013   had first 2010    Family History  Problem Relation Age of Onset   Sarcoidosis Mother    Hypertension Father    Healthy Brother    Diabetes Maternal Grandfather    High blood pressure Maternal Grandfather    Breast cancer Maternal Great-grandmother    Colon polyps Neg Hx    Colon cancer Neg Hx     Social History   Tobacco Use   Smoking status: Never   Smokeless tobacco: Never  Vaping Use   Vaping status: Never Used  Substance Use Topics   Alcohol use: Never   Drug use: Never    Review of Systems  Constitutional:  Negative for diaphoresis, fever, malaise/fatigue and weight loss.  Respiratory:  Negative for cough, shortness of breath and wheezing.   Cardiovascular:  Negative for chest pain, palpitations, orthopnea, claudication, leg swelling and PND.        Objective    BP 120/79 (Cuff Size: Normal)   Pulse 91   Temp 98.6 F (37 C) (Oral)   Resp 16   Ht 5' (1.524 m)   Wt 120 lb 11.2 oz (54.7 kg)   LMP 03/27/2024 (Exact Date)   SpO2 99%    Breastfeeding No   BMI 23.57 kg/m   Physical Exam Constitutional:      General: She is not in acute distress.    Appearance: Normal appearance.     Comments: Athletic and healthy appearing  HENT:     Head: Normocephalic.  Neck:     Vascular: No carotid bruit.  Cardiovascular:     Rate and Rhythm: Normal rate and regular rhythm.     Pulses: Normal pulses.     Heart sounds: Normal heart sounds.  Pulmonary:     Effort: Pulmonary effort is normal.     Breath sounds: Normal breath sounds.  Abdominal:     General: Bowel sounds are normal.     Palpations: Abdomen is soft.  Musculoskeletal:     Cervical back: Neck supple. No tenderness.     Right lower leg: No edema.     Left lower leg: No edema.  Neurological:     Mental Status: She is alert.         Assessment & Plan:  Seasonal allergic rhinitis due to pollen Assessment & Plan: Mild.  OTC meds as needed, with some exposure precautions in the Spring.   Gestational hypertension w/o significant proteinuria in 3rd trimester Assessment &  Plan: So noted.  I encouraged her to consider a panel of labs in the coming year, though no urgency based on her appearance today.  She will also f/u with her GYN in the coming months.  Again commended on healthy habits.  Vaccines also briefly reviewed today as well.     Return if symptoms worsen or fail to improve.   REDDING PONCE NORLEEN FALCON., MD

## 2024-04-10 NOTE — Assessment & Plan Note (Signed)
 Mild.  OTC meds as needed, with some exposure precautions in the Spring.

## 2024-04-10 NOTE — Assessment & Plan Note (Signed)
 So noted.  I encouraged her to consider a panel of labs in the coming year, though no urgency based on her appearance today.  She will also f/u with her GYN in the coming months.  Again commended on healthy habits.  Vaccines also briefly reviewed today as well.

## 2024-06-25 ENCOUNTER — Encounter (HOSPITAL_BASED_OUTPATIENT_CLINIC_OR_DEPARTMENT_OTHER): Payer: Self-pay | Admitting: Family Medicine

## 2024-07-18 ENCOUNTER — Encounter: Payer: Self-pay | Admitting: Obstetrics and Gynecology
